# Patient Record
Sex: Male | Born: 1980 | Race: White | Hispanic: No | Marital: Married
Health system: Southern US, Community
[De-identification: ages and names within clinical notes are randomized; demographics above are authoritative.]

## PROBLEM LIST (undated history)

## (undated) DIAGNOSIS — I471 Supraventricular tachycardia, unspecified: Secondary | ICD-10-CM

---

## 2004-12-02 ENCOUNTER — Emergency Department: Payer: Self-pay | Admitting: Emergency Medicine

## 2005-01-22 ENCOUNTER — Emergency Department: Payer: Self-pay | Admitting: Unknown Physician Specialty

## 2006-03-24 IMAGING — CR DG KNEE COMPLETE 4+V*R*
1 series · 4 of 4 positions shown · non-contrast
Comparison: none

REASON FOR EXAM: knee injury
COMMENTS:  LMP: (Male)

PROCEDURE:     DXR - DXR KNEE RT COMP WITH OBLIQUES  - December 02, 2004  [DATE]
RESULT:     Four views reveals no acute fractures or dislocations. The joint
spaces are intact. No joint effusion is identified.

[Series 1: view not recorded · 0.17mm/px · 4 of 4 slices shown]
[im 1/4]
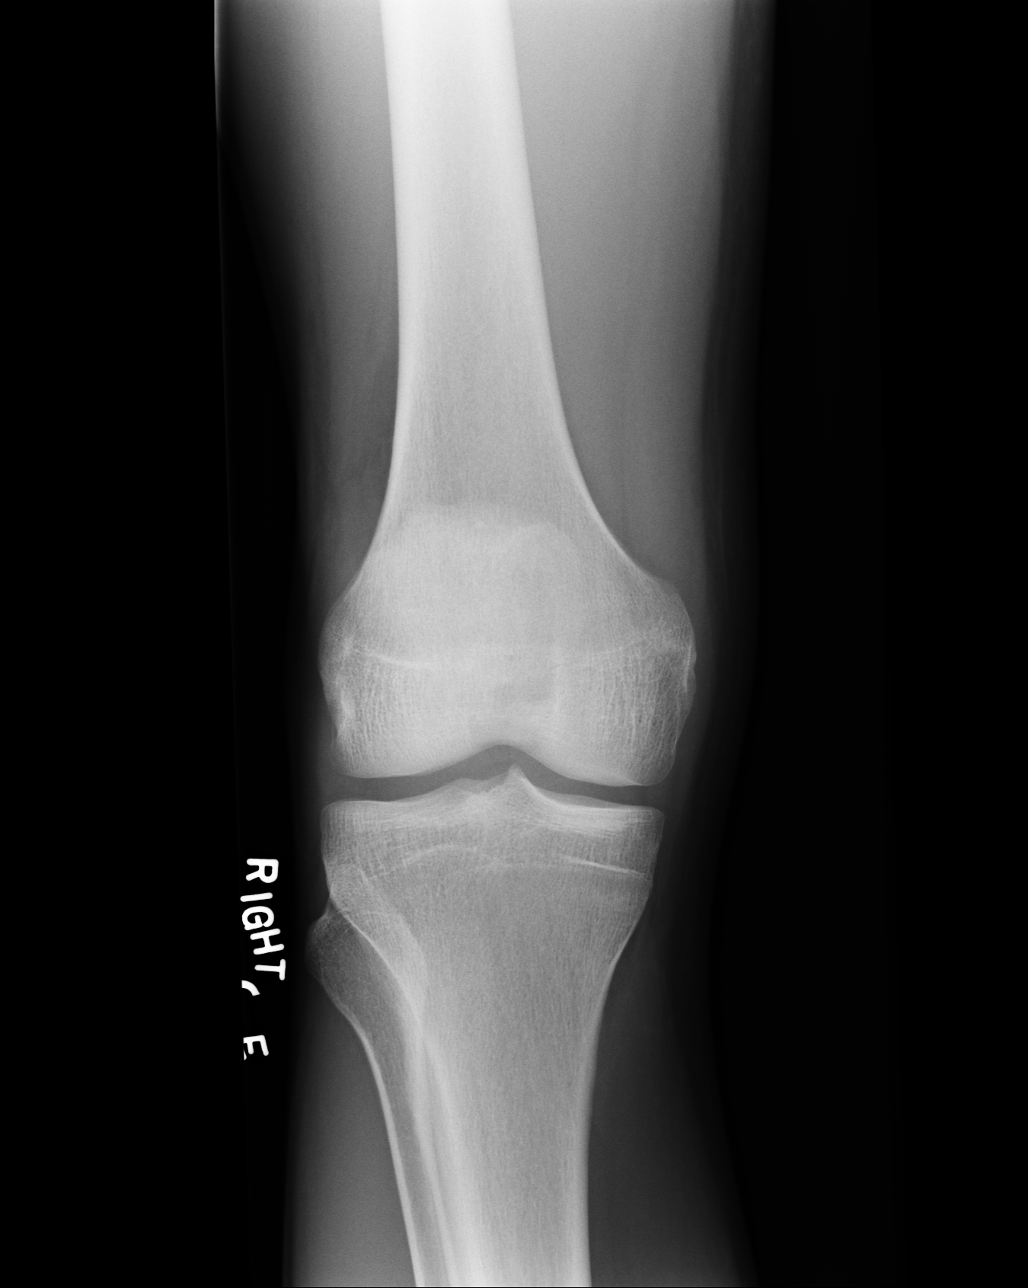
[im 2/4]
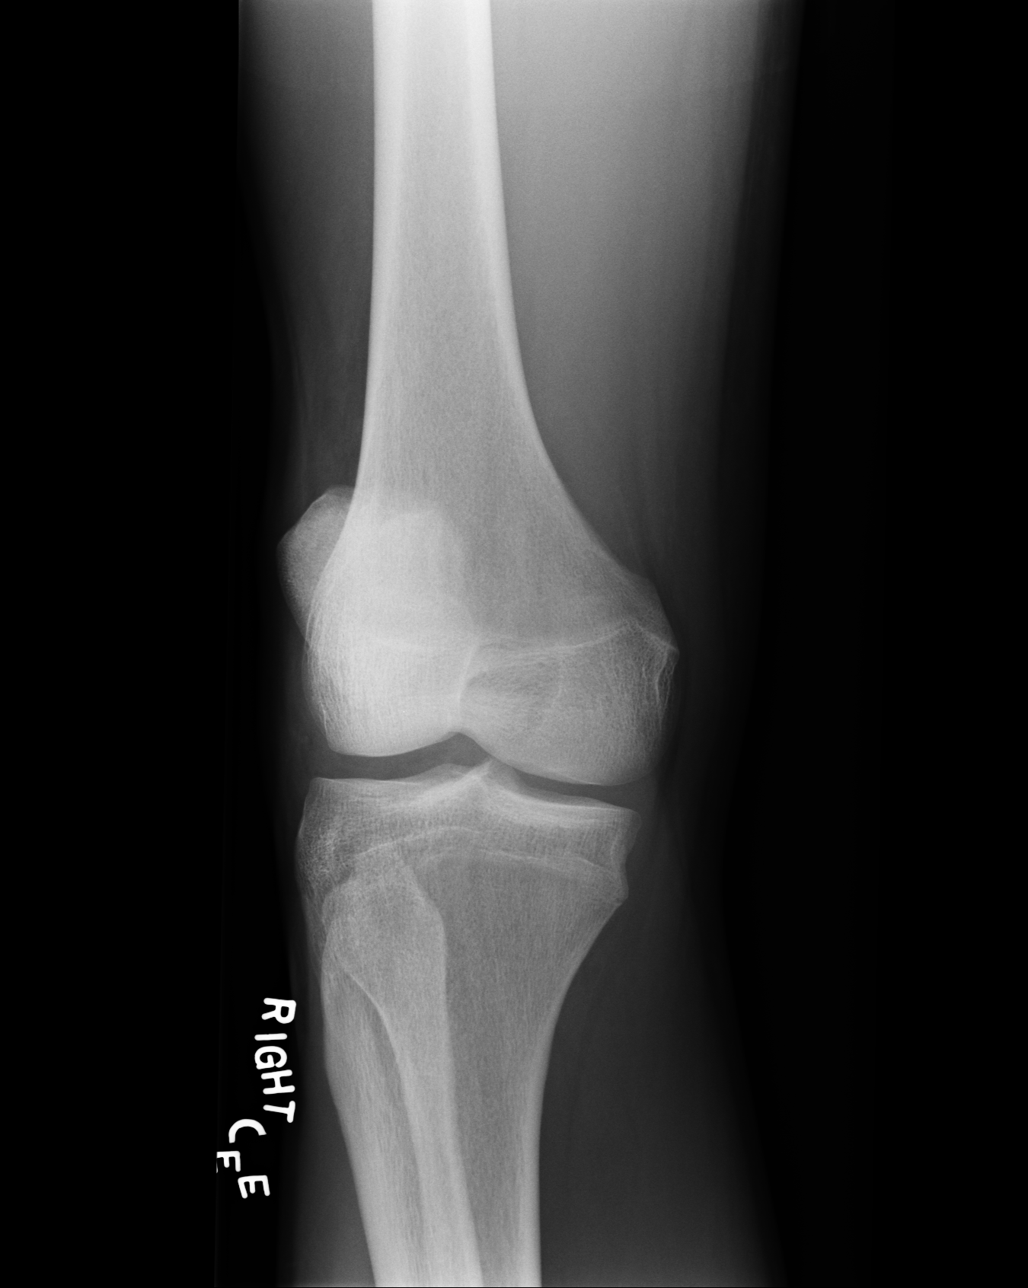
[im 3/4]
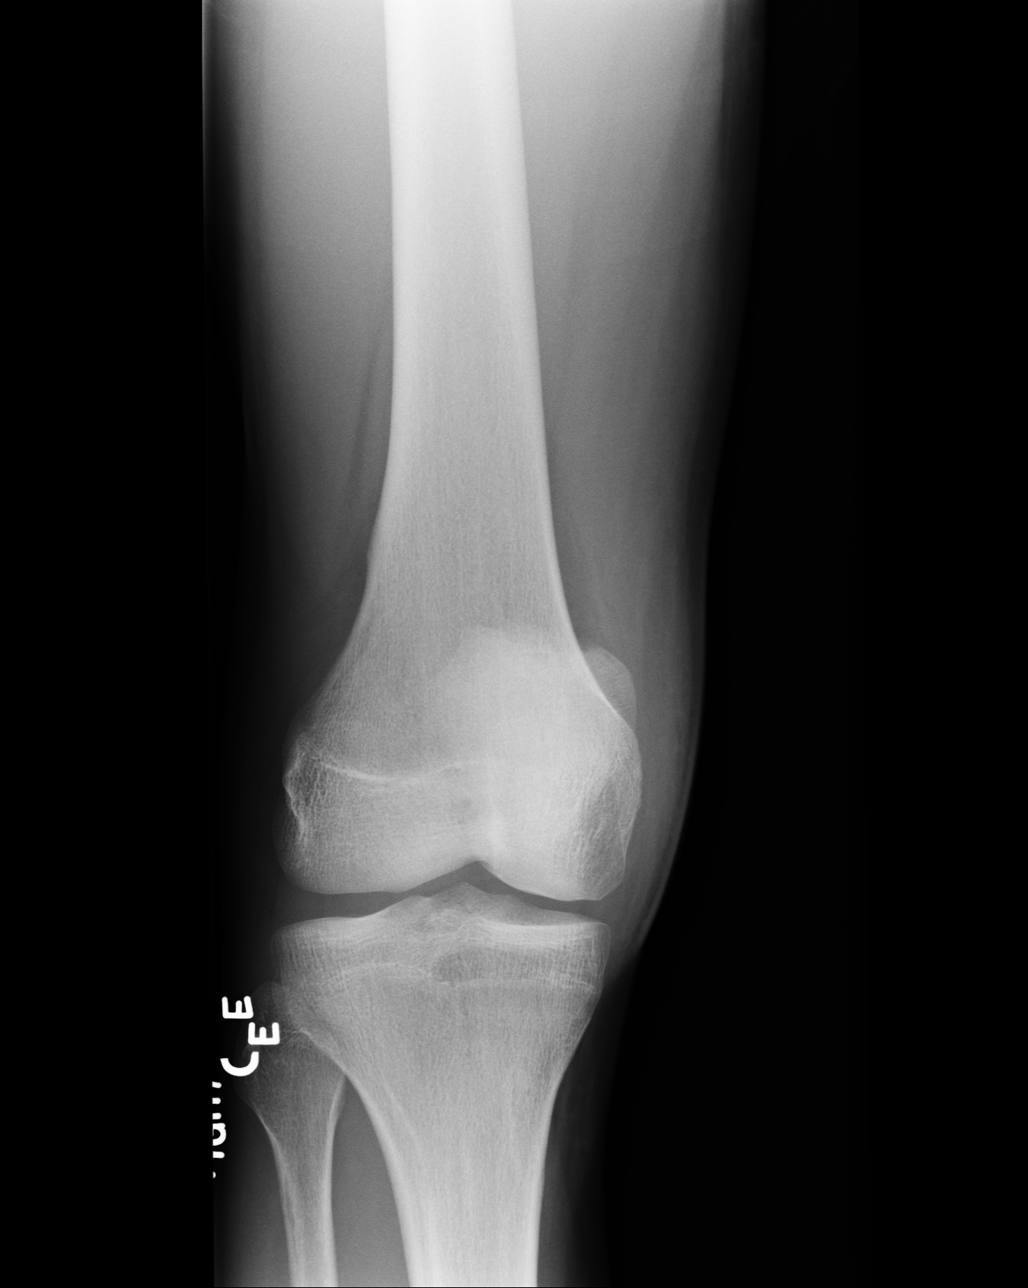
[im 4/4]
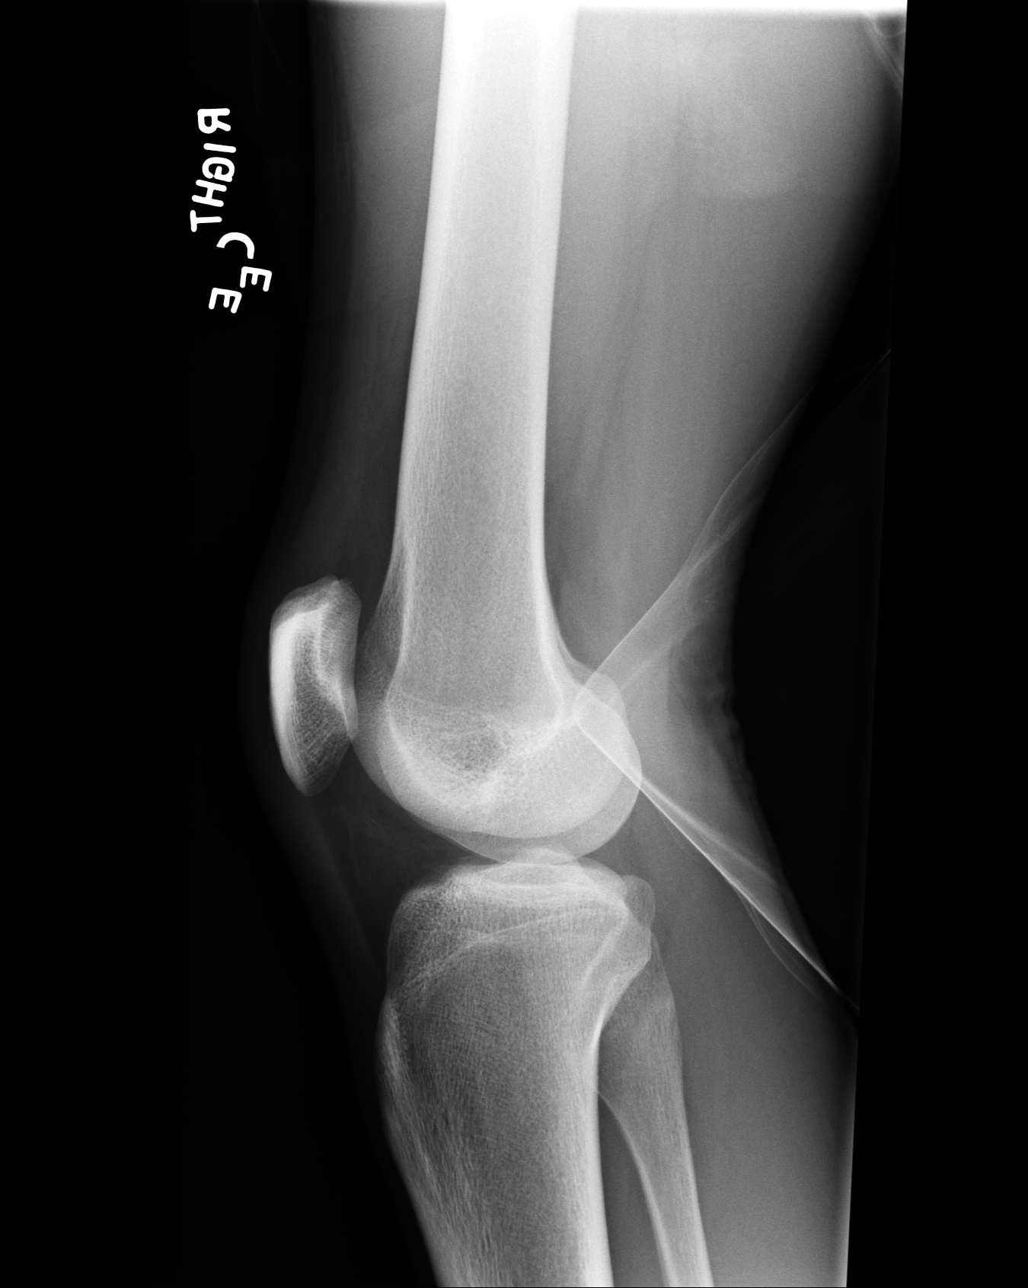

[4 of 4 positions shown; findings below may reference images not displayed]

IMPRESSION: 1)No acute fracture is seen of the RIGHT knee.

## 2013-09-21 ENCOUNTER — Emergency Department: Payer: Self-pay | Admitting: Emergency Medicine

## 2013-09-21 LAB — BASIC METABOLIC PANEL
ANION GAP: 7 (ref 7–16)
BUN: 14 mg/dL (ref 7–18)
CALCIUM: 9.2 mg/dL (ref 8.5–10.1)
CHLORIDE: 108 mmol/L — AB (ref 98–107)
CREATININE: 1.27 mg/dL (ref 0.60–1.30)
Co2: 25 mmol/L (ref 21–32)
EGFR (African American): >60
EGFR (Non-African Amer.): >60
Glucose: 101 mg/dL — ABNORMAL HIGH (ref 65–99)
Osmolality: 280 (ref 275–301)
Potassium: 3.1 mmol/L — ABNORMAL LOW (ref 3.5–5.1)
Sodium: 140 mmol/L (ref 136–145)

## 2013-09-21 LAB — CBC
HCT: 47.6 % (ref 40.0–52.0)
HGB: 16.3 g/dL (ref 13.0–18.0)
MCH: 32 pg (ref 26.0–34.0)
MCHC: 34.3 g/dL (ref 32.0–36.0)
MCV: 93 fL (ref 80–100)
Platelet: 355 10*3/uL (ref 150–440)
RBC: 5.1 10*6/uL (ref 4.40–5.90)
RDW: 12 % (ref 11.5–14.5)
WBC: 11.5 10*3/uL — ABNORMAL HIGH (ref 3.8–10.6)

## 2013-12-13 ENCOUNTER — Emergency Department: Payer: Self-pay | Admitting: Emergency Medicine

## 2013-12-13 LAB — PHOSPHORUS: PHOSPHORUS: 1.8 mg/dL — AB (ref 2.5–4.9)

## 2013-12-13 LAB — BASIC METABOLIC PANEL
Anion Gap: 8 (ref 7–16)
BUN: 13 mg/dL (ref 7–18)
CO2: 24 mmol/L (ref 21–32)
Calcium, Total: 9.3 mg/dL (ref 8.5–10.1)
Chloride: 104 mmol/L (ref 98–107)
Creatinine: 1.13 mg/dL (ref 0.60–1.30)
EGFR (African American): >60
GLUCOSE: 125 mg/dL — AB (ref 65–99)
OSMOLALITY: 274 (ref 275–301)
Potassium: 3.5 mmol/L (ref 3.5–5.1)
Sodium: 136 mmol/L (ref 136–145)

## 2013-12-13 LAB — CBC
HCT: 49 %
HGB: 16.5 g/dL
MCH: 32.1 pg
MCHC: 33.7 g/dL
MCV: 95 fL
Platelet: 275 10*3/uL
RBC: 5.13 x10 6/mm 3
RDW: 12.5 %
WBC: 9.6 10*3/uL

## 2013-12-13 LAB — TROPONIN I: Troponin-I: <0.02 ng/mL

## 2013-12-13 LAB — MAGNESIUM: Magnesium: 1.9 mg/dL

## 2015-04-04 IMAGING — CR DG CHEST 1V PORT
1 series · 2 of 2 positions shown · non-contrast
Comparison: None.

CLINICAL DATA: Tachycardia.

EXAM:
PORTABLE CHEST - 1 VIEW

[Series 1: ap · 0.17mm/px · 2 of 2 slices shown]
[im 1/2]
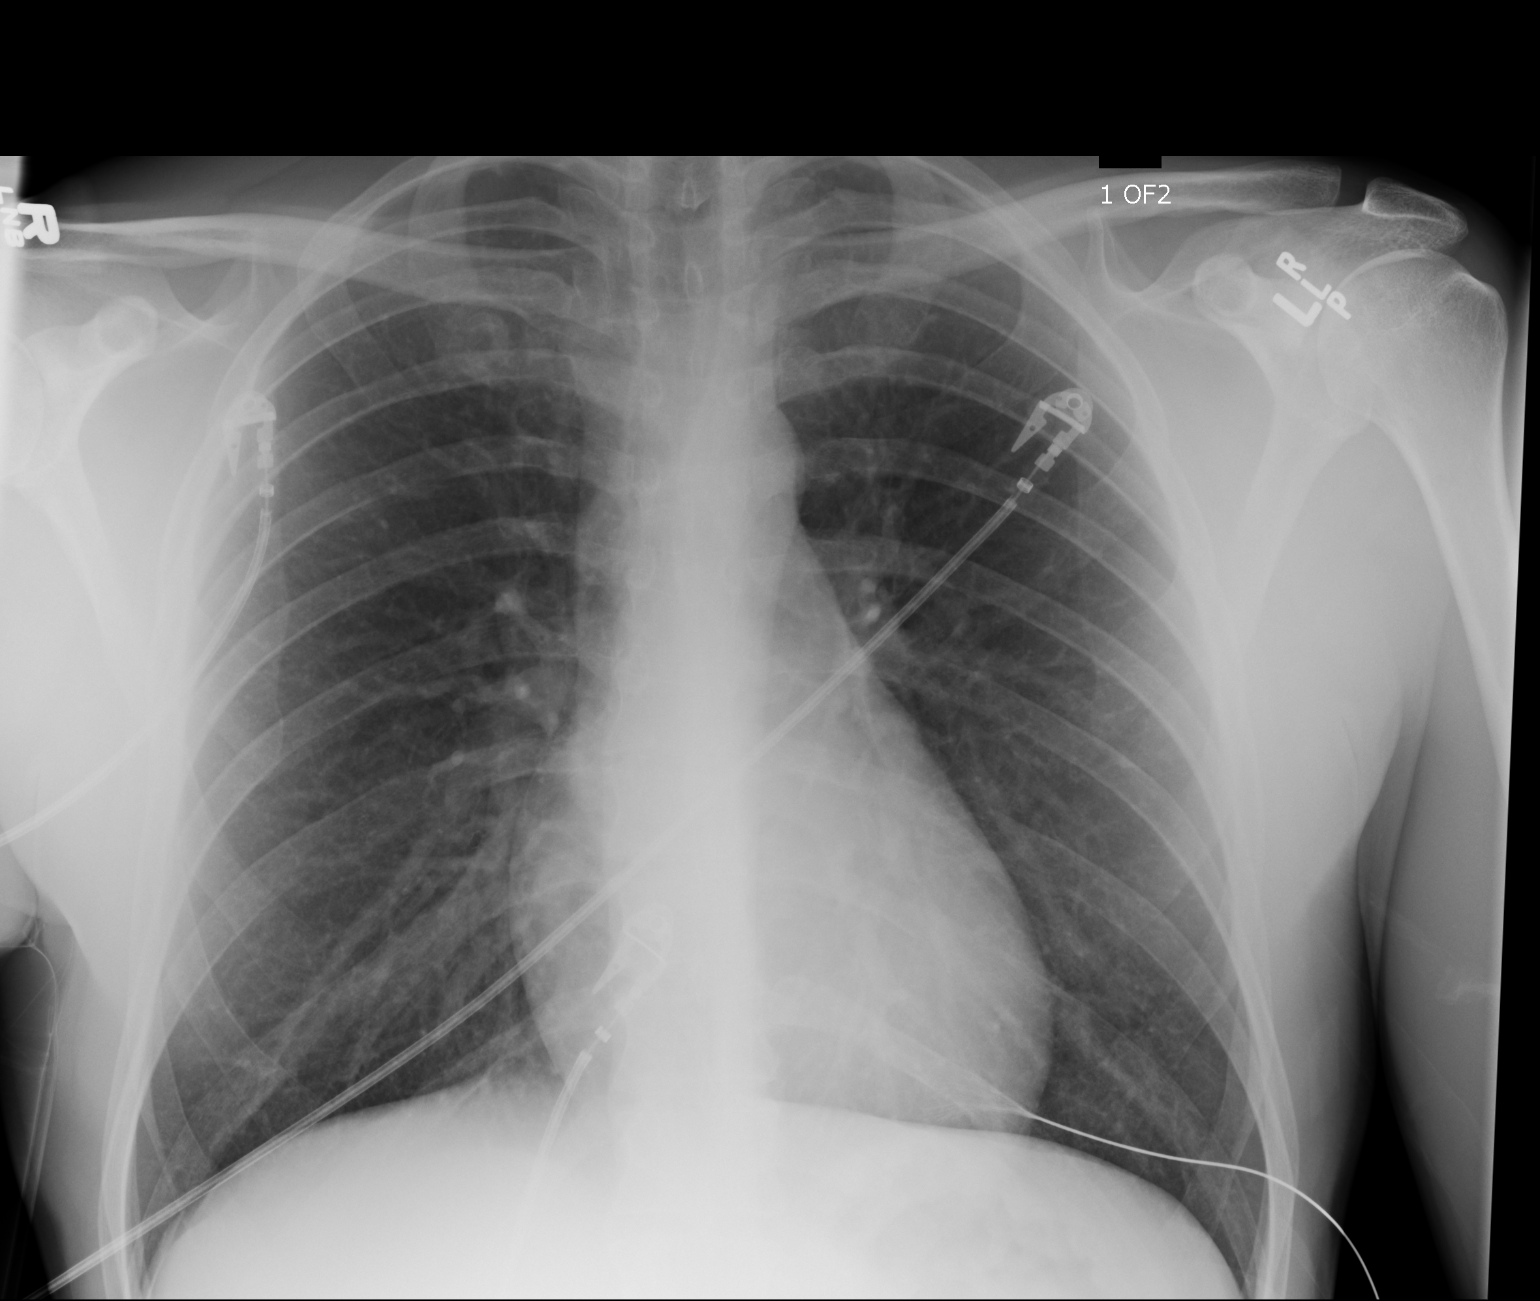
[im 2/2]
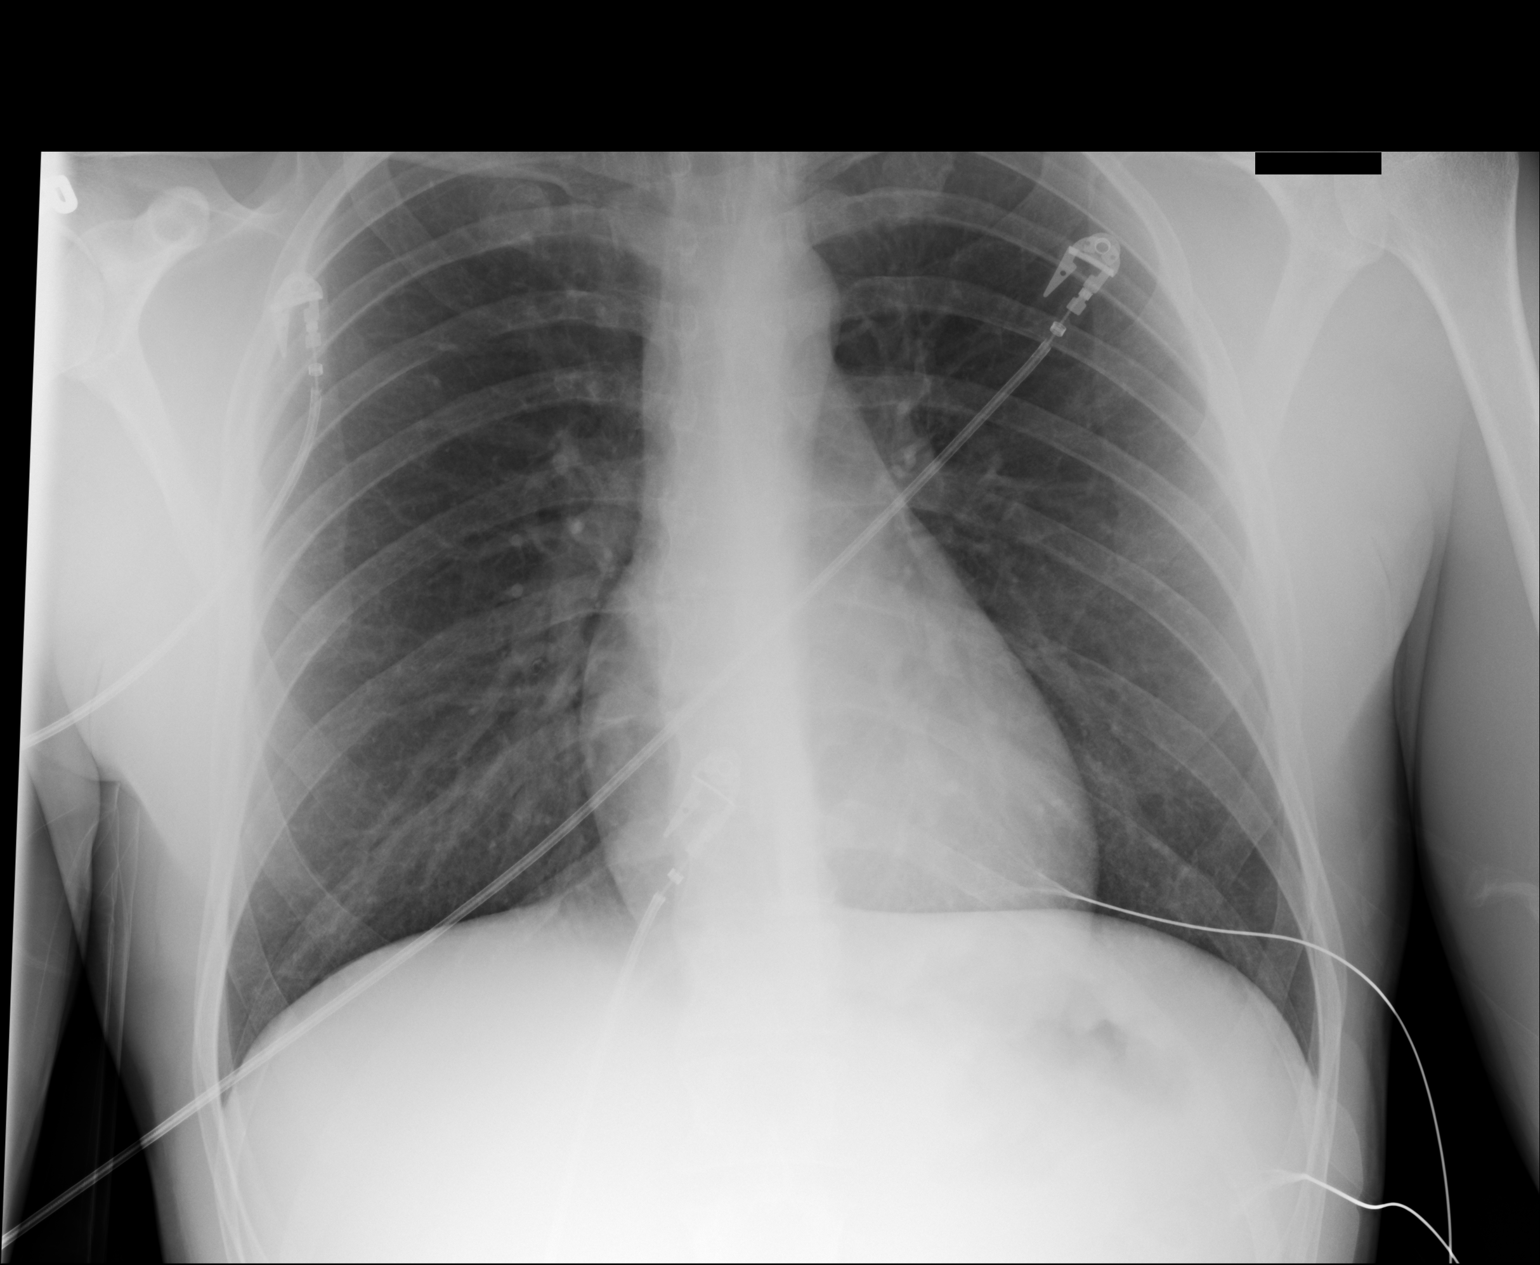

[2 of 2 positions shown; findings below may reference images not displayed]

FINDINGS: The heart size and mediastinal contours are within normal limits.
Both lungs are clear. The visualized skeletal structures are
unremarkable.
IMPRESSION: Negative one-view chest.

## 2016-12-12 ENCOUNTER — Emergency Department
Admission: EM | Admit: 2016-12-12 | Discharge: 2016-12-12 | Disposition: A | Discharge status: Home / Self Care | Payer: 59 | Attending: Emergency Medicine | Admitting: Emergency Medicine

## 2016-12-12 ENCOUNTER — Emergency Department: Payer: 59

## 2016-12-12 DIAGNOSIS — Y999 Unspecified external cause status: Secondary | ICD-10-CM | POA: Insufficient documentation

## 2016-12-12 DIAGNOSIS — S99922A Unspecified injury of left foot, initial encounter: Secondary | ICD-10-CM | POA: Diagnosis present

## 2016-12-12 DIAGNOSIS — Y939 Activity, unspecified: Secondary | ICD-10-CM | POA: Diagnosis not present

## 2016-12-12 DIAGNOSIS — Y929 Unspecified place or not applicable: Secondary | ICD-10-CM | POA: Diagnosis not present

## 2016-12-12 DIAGNOSIS — S92425A Nondisplaced fracture of distal phalanx of left great toe, initial encounter for closed fracture: Secondary | ICD-10-CM | POA: Diagnosis not present

## 2016-12-12 DIAGNOSIS — X58XXXA Exposure to other specified factors, initial encounter: Secondary | ICD-10-CM | POA: Diagnosis not present

## 2016-12-12 MED ORDER — TRAMADOL HCL 50 MG PO TABS: 50.0000 mg | ORAL_TABLET | Freq: Four times a day (QID) | ORAL | 0 refills | Status: AC | PRN

## 2016-12-12 MED ORDER — MELOXICAM 15 MG PO TABS: 15.0000 mg | ORAL_TABLET | Freq: Every day | ORAL | 0 refills | Status: AC

## 2016-12-12 NOTE — ED Provider Notes (Signed)
[facility] Emergency Department Provider Note ____________________________________________  Time seen: Approximately 8:29 PM  I have reviewed the triage vital signs and the nursing notes.   HISTORY  Chief Complaint Toe Pain    HPI Steven Curtis is a 36 y.o. male who presents to the emergency department for evaluation ofpain to the left great toe post injury.  No past medical history on file.  There are no active problems to display for this patient.   No past surgical history on file.  Prior to Admission medications   Medication Sig Start Date End Date Taking? Authorizing Provider  meloxicam (MOBIC) 15 MG tablet Take 1 tablet (15 mg total) by mouth daily. 12/12/16   Chinita Pester, FNP  traMADol (ULTRAM) 50 MG tablet Take 1 tablet (50 mg total) by mouth every 6 (six) hours as needed. 12/12/16   Chinita Pester, FNP    Allergies Patient has no known allergies.  No family history on file.  Social History Social History  Substance Use Topics  . Smoking status: Not on file  . Smokeless tobacco: Not on file  . Alcohol use Not on file    Review of Systems Constitutional: No recent illness. Musculoskeletal: Pain in Left great toe Skin: Negative for rash, wound, lesion. Neurological: Negative for focal weakness or numbness.  ____________________________________________   PHYSICAL EXAM:  VITAL SIGNS: ED Triage Vitals [12/12/16 1928]  Enc Vitals Group     BP 139/75     Pulse Rate 81     Resp 20     Temp 98.2 F (36.8 C)     Temp Source Oral     SpO2 100 %     Weight 158 lb (71.7 kg)     Height  (1.803 m)     Head Circumference      Peak Flow      Pain Score 7     Pain Loc      Pain Edu?      Excl. in GC?     Constitutional: Alert and oriented. Well appearing and in no acute distress. Eyes: Conjunctivae are normal. EOMI. Head: Atraumatic. Neck: No stridor.  Respiratory: Normal respiratory effort.   Musculoskeletal: Focal  tenderness over the medial aspect of the distal left great toe without obvious deformity. Neurologic:  Normal speech and language. No gross focal neurologic deficits are appreciated. Speech is normal. No gait instability. Skin:  Skin is warm, dry and intact. Atraumatic. Psychiatric: Mood and affect are normal. Speech and behavior are normal.  ____________________________________________   LABS (all labs ordered are listed, but only abnormal results are displayed)  Labs Reviewed - No data to display ____________________________________________  RADIOLOGY  Acute, nondisplaced first distal phalanx fracture of the left great toe. I, Kem Boroughs, personally viewed and evaluated these images (plain radiographs) as part of my medical decision making, as well as reviewing the written report by the radiologist.  ___________________________________________   PROCEDURES  Procedure(s) performed: After great toe buddy taped to the second toe and cast shoe applied by RN. Patient neurovascularly intact post-application.  ____________________________________________   INITIAL IMPRESSION / ASSESSMENT AND PLAN / ED COURSE  36 year old male presenting to the emergency department for evaluation after sustaining an injury to the left great toe. Nondisplaced first distal phalanx fracture identified on x-ray. He was instructed to rest, ice, and elevate the extremity. He was instructed to follow-up with podiatry if symptoms are not improving over the next couple weeks. He was given  meloxicam and tramadol prescriptions. He was advised to return to the emergency department for symptoms that change or worsen if he is unable schedule an appointment.  Pertinent labs & imaging results that were available during my care of the patient were reviewed by me and considered in my medical decision making (see chart for details).  _________________________________________   FINAL CLINICAL IMPRESSION(S) / ED  DIAGNOSES  Final diagnoses:  Closed nondisplaced fracture of distal phalanx of left great toe, initial encounter    Discharge Medication List as of 12/12/2016  8:32 PM    START taking these medications   Details  meloxicam (MOBIC) 15 MG tablet Take 1 tablet (15 mg total) by mouth daily., Starting Mon 12/12/2016, Print    traMADol (ULTRAM) 50 MG tablet Take 1 tablet (50 mg total) by mouth every 6 (six) hours as needed., Starting Mon 12/12/2016, Print        If controlled substance prescribed during this visit, 12 month history viewed on the [facility] prior to issuing an initial prescription for Schedule II or III opiod.    Chinita Pester, FNP 12/12/16 2316    Chinita Pester, FNP 12/12/16 2316    Minna Antis, MD 12/12/16 2322

## 2016-12-12 NOTE — ED Notes (Signed)
Pt discharged to home.  Family member driving.  Discharge instructions reviewed.  Verbalized understanding.  No questions or concerns at this time.  Teach back verified.  Pt in NAD.  No items left in ED.   

## 2018-04-03 IMAGING — DX DG FOOT COMPLETE 3+V*L*
3 series · 3 of 3 positions shown · non-contrast
Comparison: None.

CLINICAL DATA: LEFT great toe pain after squats tonight.

EXAM:
LEFT FOOT - COMPLETE 3+ VIEW

[foot ap]
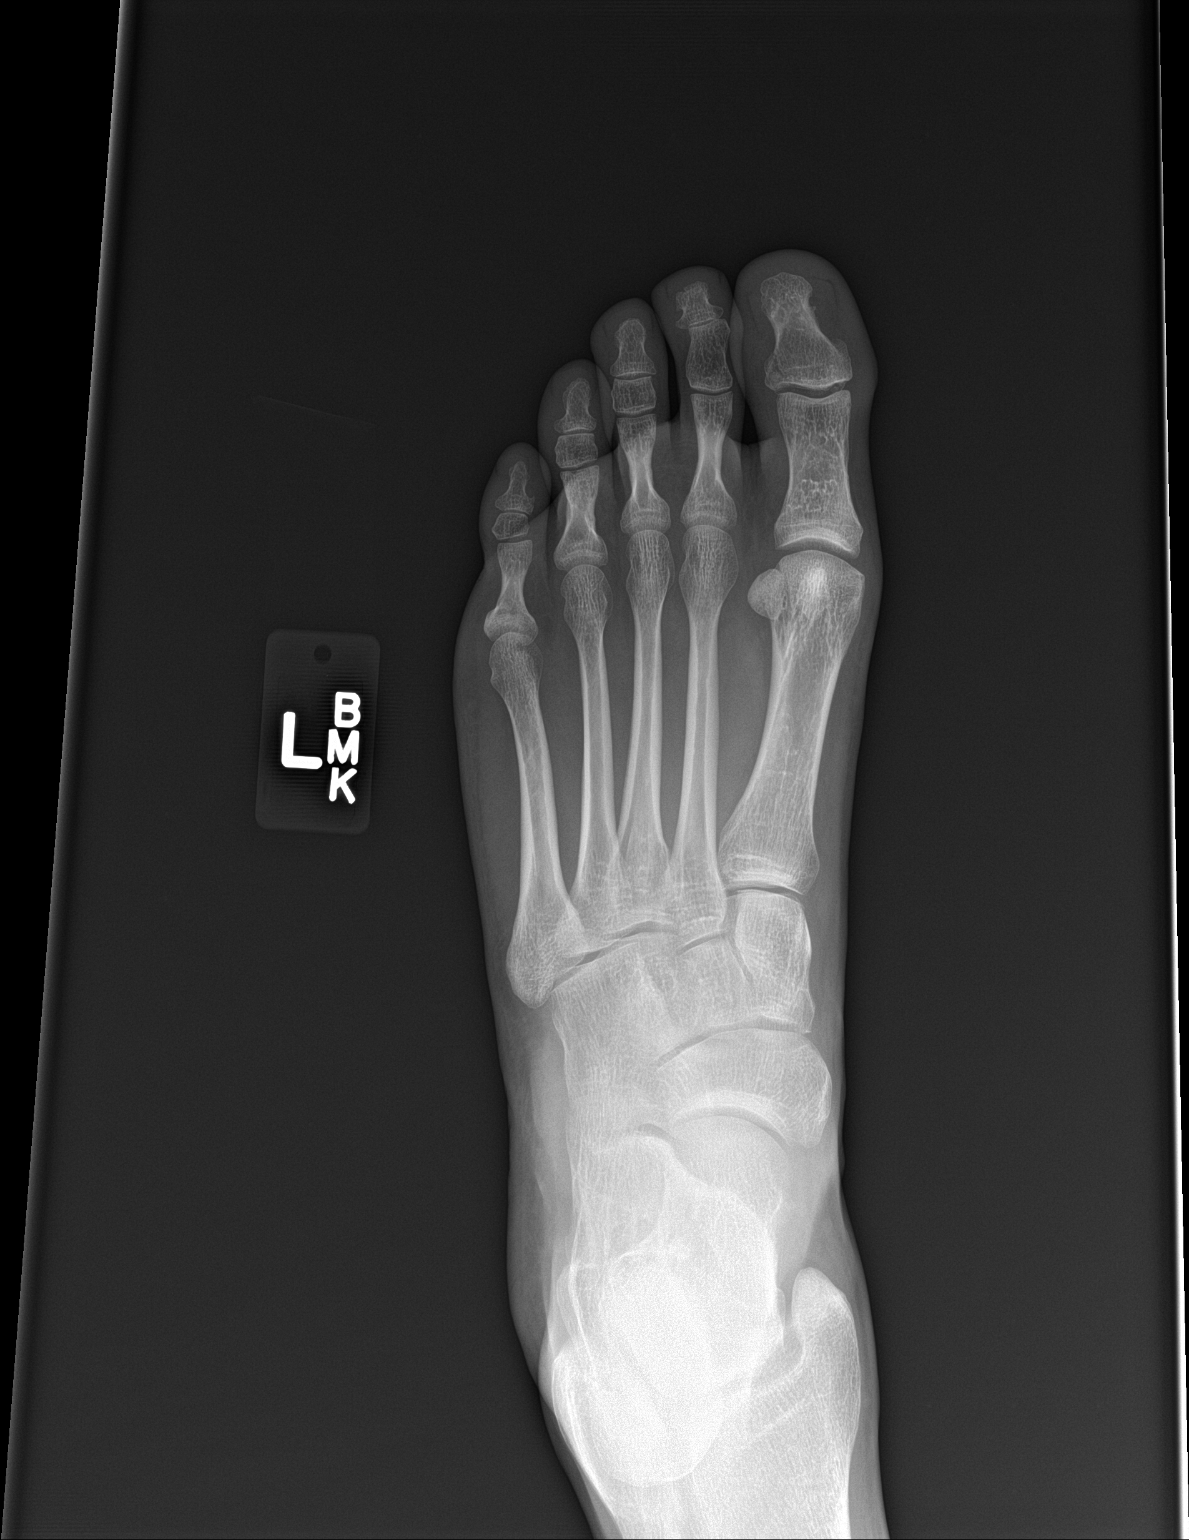

[foot obl]
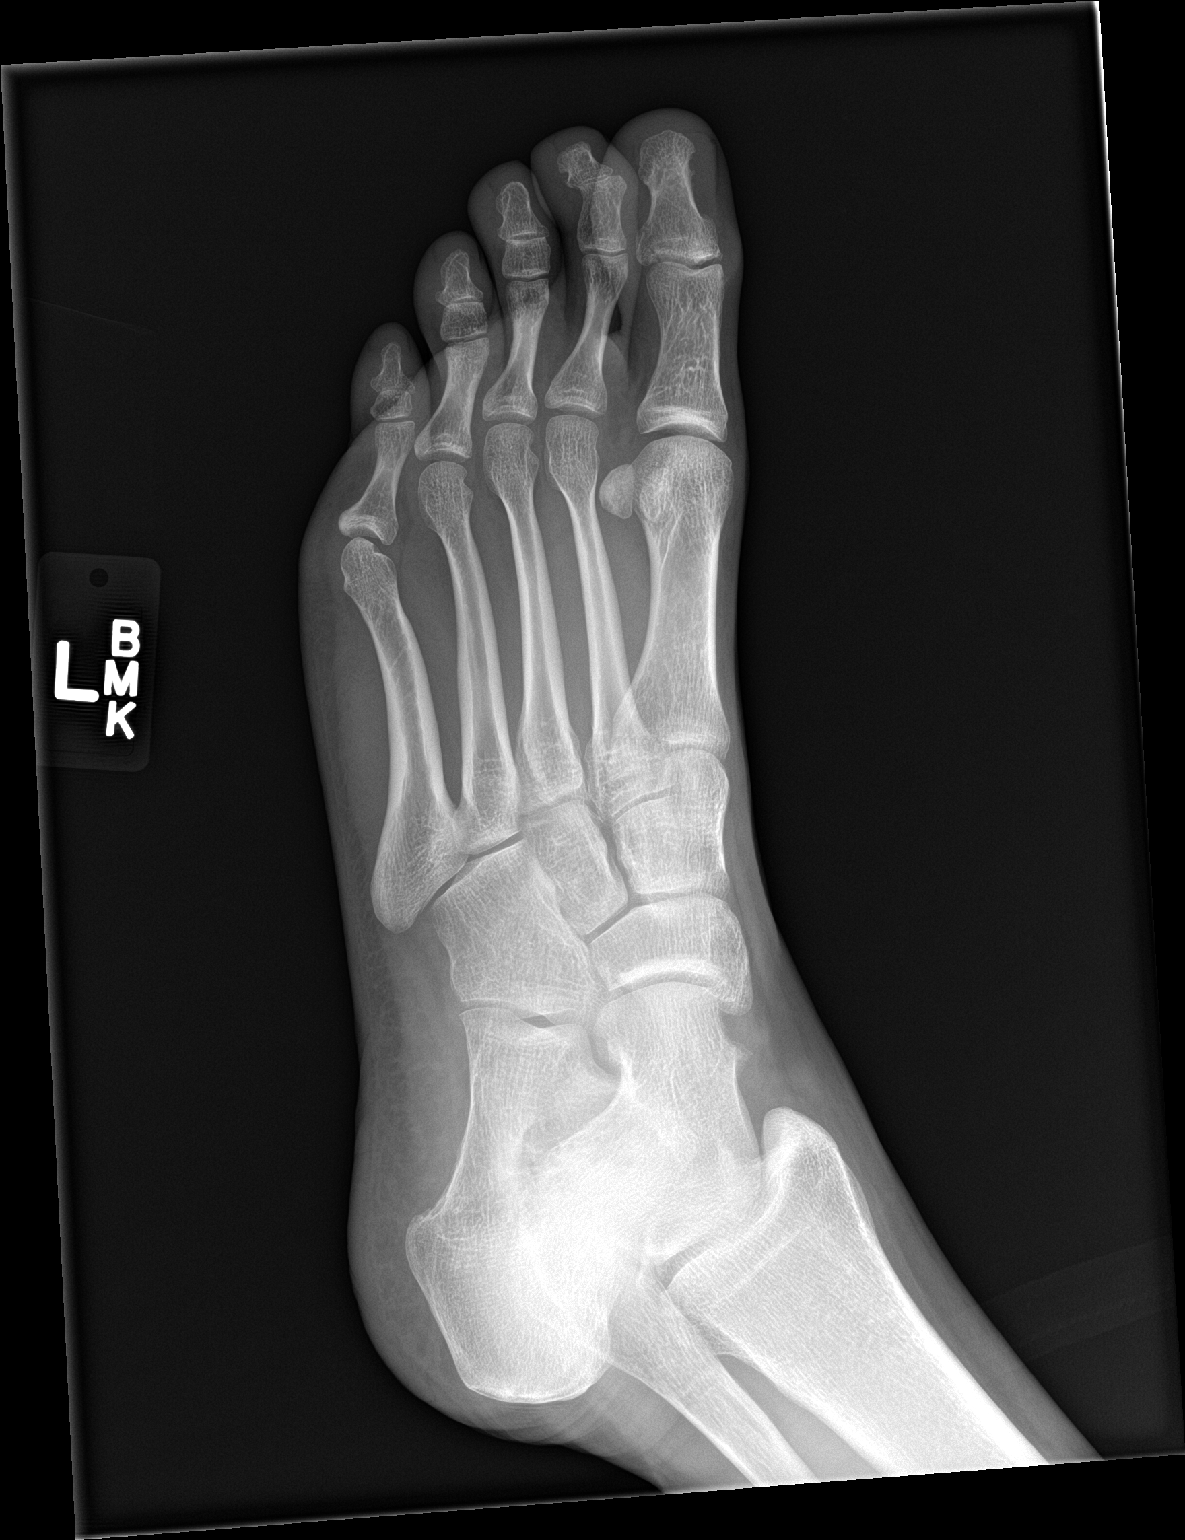

[foot lat]
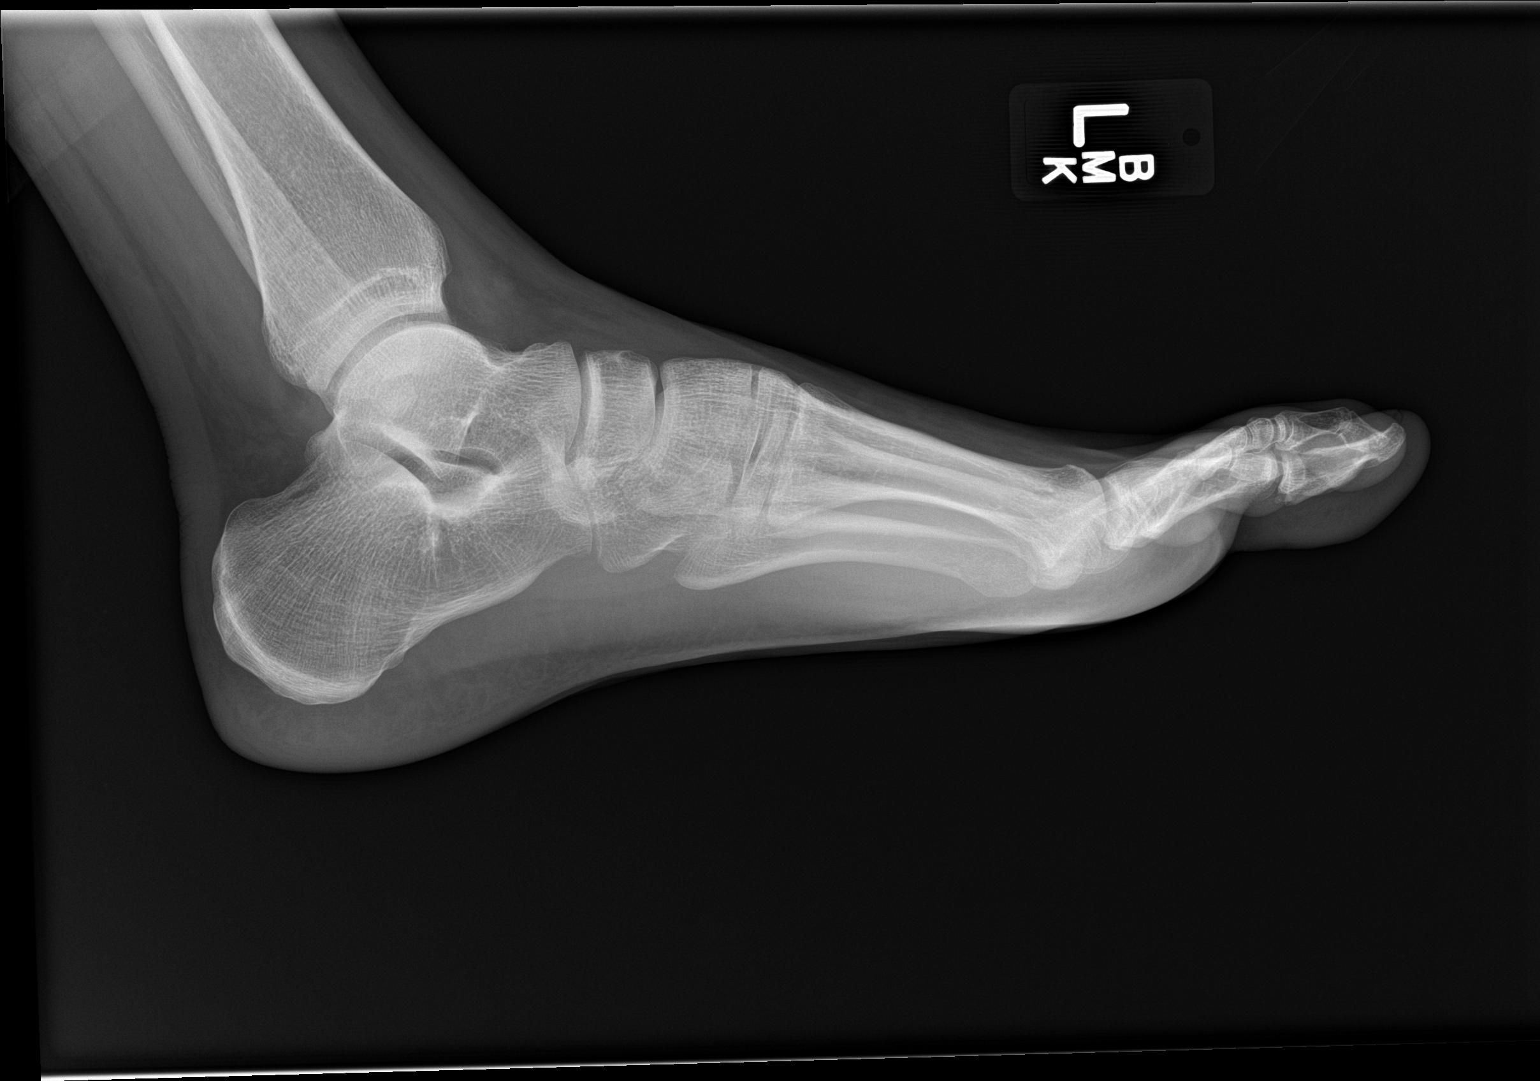

[3 of 3 positions shown; findings below may reference images not displayed]

FINDINGS: Acute nondisplaced oblique fracture through the lateral aspect first
distal phalanx with the intra-articular extension to the
interphalangeal joint space. No destructive bony lesions. No
dislocation. Soft tissue planes are normal.
IMPRESSION: Acute nondisplaced first distal phalanx fracture.  No dislocation.

## 2018-04-11 ENCOUNTER — Encounter: Payer: Self-pay | Admitting: Emergency Medicine

## 2018-04-11 ENCOUNTER — Other Ambulatory Visit: Payer: Self-pay

## 2018-04-11 ENCOUNTER — Emergency Department
Admission: EM | Admit: 2018-04-11 | Discharge: 2018-04-11 | Disposition: A | Discharge status: Home / Self Care | Payer: 59 | Attending: Emergency Medicine | Admitting: Emergency Medicine

## 2018-04-11 DIAGNOSIS — I471 Supraventricular tachycardia: Secondary | ICD-10-CM | POA: Diagnosis not present

## 2018-04-11 DIAGNOSIS — Z79899 Other long term (current) drug therapy: Secondary | ICD-10-CM | POA: Diagnosis not present

## 2018-04-11 DIAGNOSIS — R0602 Shortness of breath: Secondary | ICD-10-CM | POA: Diagnosis present

## 2018-04-11 HISTORY — DX: Supraventricular tachycardia: I47.1

## 2018-04-11 HISTORY — DX: Supraventricular tachycardia, unspecified: I47.10

## 2018-04-11 MED ORDER — ADENOSINE 12 MG/4ML IV SOLN
INTRAVENOUS | Status: AC
  Filled 2018-04-11: qty 4

## 2018-04-11 NOTE — ED Provider Notes (Addendum)
[facility] Emergency Department Provider Note  ____________________________________________   I have reviewed the triage vital signs and the nursing notes. Where available I have reviewed prior notes and, if possible and indicated, outside hospital notes.    HISTORY  Chief Complaint Tachycardia    HPI Steven Curtis is a 37 y.o. male with a history of recurrent SVT, has had to have adenosine several times he states, he has seen electrophysiologist you have recommended ablation but he decided not to have that done.  He states that it has been a year or 2 since his last episode.  He states that is not been otherwise ill he was at home doing normal activities and went into SVT about 45 minutes ago and drove himself in here.  No chest pain.  He states he could feel slightly short of breath.  He knew it was SVT, felt the palpitations and he feels that his shortness of breath is mostly due to anxiety about receiving adenosine.  In any event, patient is compliant with his metoprolol has no other complaints and presents with a heart rate of 212       Past Medical History:  Diagnosis Date  . SVT (supraventricular tachycardia) (HCC)     There are no active problems to display for this patient.   History reviewed. No pertinent surgical history.  Prior to Admission medications   Medication Sig Start Date End Date Taking? Authorizing Provider  meloxicam (MOBIC) 15 MG tablet Take 1 tablet (15 mg total) by mouth daily. 12/12/16   Triplett, Cari B, FNP  traMADol (ULTRAM) 50 MG tablet Take 1 tablet (50 mg total) by mouth every 6 (six) hours as needed. 12/12/16   Chinita Pesterriplett, Cari B, FNP    Allergies Patient has no known allergies.  No family history on file.  Social History Social History   Tobacco Use  . Smoking status: Not on file  Substance Use Topics  . Alcohol use: Never    Frequency: Never  . Drug use: Never    Review of Systems Constitutional: No  fever/chills Eyes: No visual changes. ENT: No sore throat. No stiff neck no neck pain Cardiovascular: Denies chest pain. Respiratory: See HPI Gastrointestinal:   no vomiting.  No diarrhea.  No constipation. Genitourinary: Negative for dysuria. Musculoskeletal: Negative lower extremity swelling Skin: Negative for rash. Neurological: Negative for severe headaches, focal weakness or numbness.   ____________________________________________   PHYSICAL EXAM:  VITAL SIGNS: ED Triage Vitals  Enc Vitals Group     BP 04/11/18 1517 103/89     Pulse Rate 04/11/18 1517 95     Resp 04/11/18 1517 (!) 26     Temp 04/11/18 1517 98.1 F (36.7 C)     Temp Source 04/11/18 1517 Oral     SpO2 04/11/18 1517 100 %     Weight 04/11/18 1518 155 lb (70.3 kg)     Height 04/11/18 1518 5\' 11"  (1.803 m)     Head Circumference --      Peak Flow --      Pain Score 04/11/18 1518 0     Pain Loc --      Pain Edu? --      Excl. in GC? --     Constitutional: Alert and oriented.  Healthy-appearing young male anxious nontoxic Eyes: Conjunctivae are normal Head: Atraumatic HEENT: No congestion/rhinnorhea. Mucous membranes are moist.  Oropharynx non-erythematous Neck:   Nontender with no meningismus, no masses, no stridor Cardiovascular: Fast heart  rate around 200, regular rhythm. Grossly normal heart sounds.  Good peripheral circulation. Respiratory: Normal respiratory effort.  No retractions. Lungs CTAB. Abdominal: Soft and nontender. No distention. No guarding no rebound Back:  There is no focal tenderness or step off.  there is no midline tenderness there are no lesions noted. there is no CVA tenderness Musculoskeletal: No lower extremity tenderness, no upper extremity tenderness. No joint effusions, no DVT signs strong distal pulses no edema Neurologic:  Normal speech and language. No gross focal neurologic deficits are appreciated.  Skin:  Skin is warm, dry and intact. No rash noted. Psychiatric: Mood  and affect are normal. Speech and behavior are normal.  ____________________________________________   LABS (all labs ordered are listed, but only abnormal results are displayed)  Labs Reviewed - No data to display  Pertinent labs  results that were available during my care of the patient were reviewed by me and considered in my medical decision making (see chart for details). ____________________________________________  EKG  I personally interpreted any EKGs ordered by me or triage EKG shows SVT rate 212, narrow complex, and ossific ST changes likely rate related, normal axis After NSR conversion, rates 102, normal axis, diffuse minimal ST changes noted. ----------------------------------------- 3:55 PM on 04/11/2018 -----------------------------------------  Extremity 5 bpm no acute ST elevation or depression normal axis, anteriorly seen ST depressions of all resolved after few minutes.  ____________________________________________  RADIOLOGY  Pertinent labs & imaging results that were available during my care of the patient were reviewed by me and considered in my medical decision making (see chart for details). If possible, patient and/or family made aware of any abnormal findings.  No results found. ____________________________________________    PROCEDURES  Procedure(s) performed:   SVT cardioversion Timeout performed patient consent obtained Patient given a 10 cc syringe instructed to blow into it very hard, after about 15 seconds of that, we lay his head back and lifted his legs up.  Initially, nothing happened on the second go around however we had a direct conversion to sinus rhythm, patient immediately felt better no complications  Procedures  Critical Care performed: CRITICAL CARE Performed by: Jeanmarie Plant   Total critical care time: 36 minutes  Critical care time was exclusive of separately billable procedures and treating other patients.  Critical  care was necessary to treat or prevent imminent or life-threatening deterioration.  Critical care was time spent personally by me on the following activities: development of treatment plan with patient and/or surrogate as well as nursing, discussions with consultants, evaluation of patient's response to treatment, examination of patient, obtaining history from patient or surrogate, ordering and performing treatments and interventions, ordering and review of laboratory studies, ordering and review of radiographic studies, pulse oximetry and re-evaluation of patient's condition.   ____________________________________________   INITIAL IMPRESSION / ASSESSMENT AND PLAN / ED COURSE  Pertinent labs & imaging results that were available during my care of the patient were reviewed by me and considered in my medical decision making (see chart for details). Patient with SVT, long history of same, presents today with SVT, converted, Which was again present today. with Valsalva maneuver.  he does have rate related changes seen on prior EKGs of diffuse ST depression which was again noted today, he has no chest pain or shortness of breath, he would prefer not to have any further work-up.  We will recheck EKG, he was not antecedently ill has no ongoing symptoms after he converts, and again would prefer not to have  blood work done if possible.  We will reassess repeat EKG.  Vital signs are reassuring.  Patient was cardioverted within 5 minutes of arrival.  ----------------------------------------- 3:59 PM on 04/11/2018 -----------------------------------------  D/w dr. Lady Gary, agrees with no further work-up, he understands EKG changes and feels that they are related related, does not wish any troponins or other enzymes which I agree with.  Patient is already on 25 mg of metoprolol ER, he did not recommend any change in that, they will see him as an outpatient.  Patient has been absolutely asymptomatic and since we  cardioverted him, we will discharge.  Return precautions and follow-up given and understood.    ____________________________________________   FINAL CLINICAL IMPRESSION(S) / ED DIAGNOSES  Final diagnoses:  None      This chart was dictated using voice recognition software.  Despite best efforts to proofread,  errors can occur which can change meaning.      Jeanmarie Plant, MD 04/11/18 1527    Jeanmarie Plant, MD 04/11/18 1556    Jeanmarie Plant, MD 04/11/18 1559    Jeanmarie Plant, MD 04/11/18 1600

## 2018-04-11 NOTE — ED Triage Notes (Signed)
Patient reports working and feeling his heart speed up, became lightheaded and short of breath. Patient with history of SVT. States he take metoprolol daily. HR in 200s upon arrival. Patient converted to NSR after valsalva maneuver by MD.

## 2018-04-11 NOTE — ED Notes (Signed)
Patient ambulatory to lobby with steady gait and NAD noted. Verbalized understanding of discharge instructions and follow-up care.  

## 2018-04-11 NOTE — Discharge Instructions (Addendum)
Continue take the metoprolol, return to the emergency room for any new or worrisome symptoms, follow-up with cardiology as an outpatient.  Drink plenty of fluids.  If you have chest pain or shortness of breath or you feel worse in any way return to the emergency department

## 2019-11-30 ENCOUNTER — Other Ambulatory Visit: Payer: Self-pay

## 2019-11-30 ENCOUNTER — Emergency Department
Admission: EM | Admit: 2019-11-30 | Discharge: 2019-11-30 | Disposition: A | Discharge status: Home / Self Care | Payer: 59 | Attending: Emergency Medicine | Admitting: Emergency Medicine

## 2019-11-30 DIAGNOSIS — W269XXA Contact with unspecified sharp object(s), initial encounter: Secondary | ICD-10-CM | POA: Insufficient documentation

## 2019-11-30 DIAGNOSIS — Y999 Unspecified external cause status: Secondary | ICD-10-CM | POA: Insufficient documentation

## 2019-11-30 DIAGNOSIS — Z79899 Other long term (current) drug therapy: Secondary | ICD-10-CM | POA: Insufficient documentation

## 2019-11-30 DIAGNOSIS — S61212A Laceration without foreign body of right middle finger without damage to nail, initial encounter: Secondary | ICD-10-CM | POA: Diagnosis not present

## 2019-11-30 DIAGNOSIS — Y93G1 Activity, food preparation and clean up: Secondary | ICD-10-CM | POA: Diagnosis not present

## 2019-11-30 DIAGNOSIS — Y9289 Other specified places as the place of occurrence of the external cause: Secondary | ICD-10-CM | POA: Insufficient documentation

## 2019-11-30 NOTE — ED Provider Notes (Signed)
Emergency Department Provider Note  ____________________________________________  Time seen: Approximately 8:02 PM  I have reviewed the triage vital signs and the nursing notes.   HISTORY  Chief Complaint Laceration   Historian Patient    HPI Steven Curtis is a 39 y.o. male presents to the emergency department with a 0.5 cm avulsion laceration along tip of right middle finger sustained accidentally with a potato chopper.  Patient reports his tetanus status is up-to-date.  No numbness or tingling of the right hand.  No other alleviating measures been attempted.   Past Medical History:  Diagnosis Date  . SVT (supraventricular tachycardia) (HCC)      Immunizations up to date:  Yes.     Past Medical History:  Diagnosis Date  . SVT (supraventricular tachycardia) (HCC)     There are no problems to display for this patient.   History reviewed. No pertinent surgical history.  Prior to Admission medications   Medication Sig Start Date End Date Taking? Authorizing Provider  meloxicam (MOBIC) 15 MG tablet Take 1 tablet (15 mg total) by mouth daily. 12/12/16   Triplett, Cari B, FNP  traMADol (ULTRAM) 50 MG tablet Take 1 tablet (50 mg total) by mouth every 6 (six) hours as needed. 12/12/16   Chinita Pester, FNP    Allergies Patient has no known allergies.  History reviewed. No pertinent family history.  Social History Social History   Tobacco Use  . Smoking status: Not on file  Substance Use Topics  . Alcohol use: Never  . Drug use: Never     Review of Systems  Constitutional: No fever/chills Eyes:  No discharge ENT: No upper respiratory complaints. Respiratory: no cough. No SOB/ use of accessory muscles to breath Gastrointestinal:   No nausea, no vomiting.  No diarrhea.  No constipation. Musculoskeletal: Negative for musculoskeletal pain. Skin: Patient has laceration.    ____________________________________________   PHYSICAL EXAM:  VITAL  SIGNS: ED Triage Vitals  Enc Vitals Group     BP 11/30/19 1617 (!) 136/95     Pulse Rate 11/30/19 1614 98     Resp 11/30/19 1614 18     Temp 11/30/19 1614 98.5 F (36.9 C)     Temp Source 11/30/19 1614 Oral     SpO2 11/30/19 1614 99 %     Weight 11/30/19 1615 155 lb (70.3 kg)     Height 11/30/19 1615 5\' 11"  (1.803 m)     Head Circumference --      Peak Flow --      Pain Score 11/30/19 1615 7     Pain Loc --      Pain Edu? --      Excl. in GC? --      Constitutional: Alert and oriented. Well appearing and in no acute distress. Eyes: Conjunctivae are normal. PERRL. EOMI. Head: Atraumatic. Cardiovascular: Normal rate, regular rhythm. Normal S1 and S2.  Good peripheral circulation. Respiratory: Normal respiratory effort without tachypnea or retractions. Lungs CTAB. Good air entry to the bases with no decreased or absent breath sounds Gastrointestinal: Bowel sounds x 4 quadrants. Soft and nontender to palpation. No guarding or rigidity. No distention. Musculoskeletal: Full range of motion to all extremities. No obvious deformities noted Neurologic:  Normal for age. No gross focal neurologic deficits are appreciated.  Skin: Patient has a 0.5 centimeters laceration along the tip of the right middle finger deep to underlying dermis.  Psychiatric: Mood and affect are normal for age. Speech and behavior are normal.  ____________________________________________   LABS (all labs ordered are listed, but only abnormal results are displayed)  Labs Reviewed - No data to display ____________________________________________  EKG   ____________________________________________  RADIOLOGY   No results found.  ____________________________________________    PROCEDURES  Procedure(s) performed:     Marland KitchenMarland KitchenLaceration Repair  Date/Time: 11/30/2019 8:04 PM Performed by: Lannie Fields, PA-C Authorized by: Lannie Fields, PA-C   Consent:    Consent given by:  Patient   Risks  discussed:  Pain Anesthesia (see MAR for exact dosages):    Anesthesia method:  None Laceration details:    Location:  Finger   Finger location:  R long finger   Length (cm):  0.5   Depth (mm):  1 Repair type:    Repair type:  Simple Exploration:    Contaminated: no   Treatment:    Area cleansed with:  Betadine   Amount of cleaning:  Standard Skin repair:    Repair method:  Tissue adhesive Post-procedure details:    Dressing:  Open (no dressing)       Medications - No data to display   ____________________________________________   INITIAL IMPRESSION / ASSESSMENT AND PLAN / ED COURSE  Pertinent labs & imaging results that were available during my care of the patient were reviewed by me and considered in my medical decision making (see chart for details).      Assessment and plan: Laceration 39 year old male presents to the emergency department with a right middle finger avulsion type laceration.  Finger tourniquet was applied initially in the emergency department and bleeding was well controlled.  Dermabond was applied.  Tylenol and ibuprofen alternating for pain were recommended.  Patient reported that his tetanus status is up-to-date.  All patient questions were answered.   ____________________________________________  FINAL CLINICAL IMPRESSION(S) / ED DIAGNOSES  Final diagnoses:  Laceration of right middle finger without foreign body without damage to nail, initial encounter      NEW MEDICATIONS STARTED DURING THIS VISIT:  ED Discharge Orders    None          This chart was dictated using voice recognition software/Dragon. Despite best efforts to proofread, errors can occur which can change the meaning. Any change was purely unintentional.     Karren Cobble 11/30/19 Derrell Lolling, MD 12/02/19 [phone]

## 2019-11-30 NOTE — ED Triage Notes (Signed)
Pt comes POV after cutting end of right middle finger with potato chopper. Dressed and bleeding minimally at this time.

## 2020-01-29 ENCOUNTER — Encounter: Payer: Self-pay | Admitting: Emergency Medicine

## 2020-01-29 ENCOUNTER — Other Ambulatory Visit: Payer: Self-pay

## 2020-01-29 ENCOUNTER — Emergency Department
Admission: EM | Admit: 2020-01-29 | Discharge: 2020-01-29 | Disposition: A | Discharge status: Home / Self Care | Payer: No Typology Code available for payment source | Attending: Emergency Medicine | Admitting: Emergency Medicine

## 2020-01-29 DIAGNOSIS — I471 Supraventricular tachycardia: Secondary | ICD-10-CM | POA: Diagnosis not present

## 2020-01-29 DIAGNOSIS — R002 Palpitations: Secondary | ICD-10-CM | POA: Diagnosis present

## 2020-01-29 LAB — BASIC METABOLIC PANEL
Anion gap: 14 (ref 5–15)
BUN: 10 mg/dL (ref 6–20)
CO2: 21 mmol/L — ABNORMAL LOW (ref 22–32)
Calcium: 9.8 mg/dL (ref 8.9–10.3)
Chloride: 104 mmol/L (ref 98–111)
Creatinine, Ser: 1.68 mg/dL — ABNORMAL HIGH (ref 0.61–1.24)
GFR calc Af Amer: 59 mL/min — ABNORMAL LOW (ref >60)
GFR calc non Af Amer: 51 mL/min — ABNORMAL LOW (ref >60)
Glucose, Bld: 143 mg/dL — ABNORMAL HIGH (ref 70–99)
Potassium: 3.5 mmol/L (ref 3.5–5.1)
Sodium: 139 mmol/L (ref 135–145)

## 2020-01-29 LAB — CBC WITH DIFFERENTIAL/PLATELET
Abs Immature Granulocytes: 0.05 10*3/uL (ref 0.00–0.07)
Basophils Absolute: 0.1 10*3/uL (ref 0.0–0.1)
Basophils Relative: 1 %
Eosinophils Absolute: 0.1 10*3/uL (ref 0.0–0.5)
Eosinophils Relative: 1 %
HCT: 48.2 % (ref 39.0–52.0)
Hemoglobin: 17.8 g/dL — ABNORMAL HIGH (ref 13.0–17.0)
Immature Granulocytes: 0 %
Lymphocytes Relative: 30 %
Lymphs Abs: 3.5 10*3/uL (ref 0.7–4.0)
MCH: 33.8 pg (ref 26.0–34.0)
MCHC: 36.9 g/dL — ABNORMAL HIGH (ref 30.0–36.0)
MCV: 91.6 fL (ref 80.0–100.0)
Monocytes Absolute: 0.7 10*3/uL (ref 0.1–1.0)
Monocytes Relative: 6 %
Neutro Abs: 7.4 10*3/uL (ref 1.7–7.7)
Neutrophils Relative %: 62 %
Platelets: 457 10*3/uL — ABNORMAL HIGH (ref 150–400)
RBC: 5.26 MIL/uL (ref 4.22–5.81)
RDW: 11.6 % (ref 11.5–15.5)
WBC: 11.9 10*3/uL — ABNORMAL HIGH (ref 4.0–10.5)
nRBC: 0 % (ref 0.0–0.2)

## 2020-01-29 LAB — MAGNESIUM: Magnesium: 2.1 mg/dL (ref 1.7–2.4)

## 2020-01-29 MED ORDER — ADENOSINE 12 MG/4ML IV SOLN
INTRAVENOUS | Status: AC
  Filled 2020-01-29: qty 4

## 2020-01-29 MED ORDER — ADENOSINE 6 MG/2ML IV SOLN
INTRAVENOUS | Status: AC
  Filled 2020-01-29: qty 2

## 2020-01-29 NOTE — ED Notes (Addendum)
Dr. Fuller Plan at bedside. Pt appears to be ST right now no longer in SVT. Pt denies chest pain but states his heart feels that it's going fast. Pt states having shob. Pt performing maneuvers to convert heart rate himself on nurse and doctor arrival to room.

## 2020-01-29 NOTE — Discharge Instructions (Addendum)
You broke out of your SVT but your kidney function was slightly elevated and your sugar was slightly elevated although no signs of diabetes if this was not a fasting sugar.  You should have these rechecked by your primary care doctor in 1 to 2 weeks.  Drink plenty of fluids and return to the ER if you develop worsening symptoms or any other concerns

## 2020-01-29 NOTE — ED Triage Notes (Signed)
Pt arrival via home due to SVT. Pt has hx of SVT and is currently in this rhythm. Dr. Fuller Plan at bedside.

## 2020-01-29 NOTE — ED Provider Notes (Signed)
[facility] Emergency Department Provider Note  ____________________________________________   First MD Initiated Contact with Patient 01/29/20 1359     (approximate)  I have reviewed the triage vital signs and the nursing notes.   HISTORY  Chief Complaint Tachycardia    HPI Steven Curtis is a 39 y.o. male with SVT who comes in for palpitations.  Patient states that he had sudden onset of palpitations that were constant, severe, nothing made it better, nothing made them worse.  States he has a history of SVT.  Does not report drinking 1 alcoholic beverage today and also missing his metoprolol.  Has a history of this previously and states that he is in SVT.          Past Medical History:  Diagnosis Date  . SVT (supraventricular tachycardia) (HCC)     There are no problems to display for this patient.   History reviewed. No pertinent surgical history.  Prior to Admission medications   Medication Sig Start Date End Date Taking? Authorizing Provider  meloxicam (MOBIC) 15 MG tablet Take 1 tablet (15 mg total) by mouth daily. 12/12/16   Triplett, Cari B, FNP  traMADol (ULTRAM) 50 MG tablet Take 1 tablet (50 mg total) by mouth every 6 (six) hours as needed. 12/12/16   Chinita Pester, FNP    Allergies Patient has no known allergies.  History reviewed. No pertinent family history.  Social History Social History   Tobacco Use  . Smoking status: Not on file  Substance Use Topics  . Alcohol use: Never  . Drug use: Never      Review of Systems Constitutional: No fever/chills Eyes: No visual changes. ENT: No sore throat. Cardiovascular: Denies chest pain.  Palpitations Respiratory: Denies shortness of breath. Gastrointestinal: No abdominal pain.  No nausea, no vomiting.  No diarrhea.  No constipation. Genitourinary: Negative for dysuria. Musculoskeletal: Negative for back pain. Skin: Negative for rash. Neurological: Negative for  headaches, focal weakness or numbness. All other ROS negative ____________________________________________   PHYSICAL EXAM:  VITAL SIGNS: ED Triage Vitals  Enc Vitals Group     BP 01/29/20 1356 (!) 155/97     Pulse Rate 01/29/20 1356 (!) 125     Resp 01/29/20 1356 13     Temp 01/29/20 1356 98.9 F (37.2 C)     Temp src --      SpO2 01/29/20 1356 98 %     Weight 01/29/20 1358 150 lb (68 kg)     Height 01/29/20 1358 5\' 11"  (1.803 m)     Head Circumference --      Peak Flow --      Pain Score 01/29/20 1358 0     Pain Loc --      Pain Edu? --      Excl. in [place]? --     Constitutional: Alert and oriented. Well appearing and in no acute distress. Eyes: Conjunctivae are normal. EOMI. Head: Atraumatic. Nose: No congestion/rhinnorhea. Mouth/Throat: Mucous membranes are moist.   Neck: No stridor. Trachea Midline. FROM Cardiovascular: Tachycardic, regular rhythm. Grossly normal heart sounds.  Good peripheral circulation. Respiratory: Normal respiratory effort.  No retractions. Lungs CTAB. Gastrointestinal: Soft and nontender. No distention. No abdominal bruits.  Musculoskeletal: No lower extremity tenderness nor edema.  No joint effusions. Neurologic:  Normal speech and language. No gross focal neurologic deficits are appreciated.  Skin:  Skin is warm, dry and intact. No rash noted. Psychiatric: Mood and affect are normal. Speech and  behavior are normal. GU: Deferred   ____________________________________________   LABS (all labs ordered are listed, but only abnormal results are displayed)  Labs Reviewed  BASIC METABOLIC PANEL - Abnormal; Notable for the following components:      Result Value   CO2 21 (*)    Glucose, Bld 143 (*)    Creatinine, Ser 1.68 (*)    GFR calc non Af Amer 51 (*)    GFR calc Af Amer 59 (*)    All other components within normal limits  CBC WITH DIFFERENTIAL/PLATELET - Abnormal; Notable for the following components:   WBC 11.9 (*)    Hemoglobin 17.8  (*)    MCHC 36.9 (*)    Platelets 457 (*)    All other components within normal limits  MAGNESIUM   ____________________________________________   ED ECG REPORT I, Vanessa Waverly, the attending physician, personally viewed and interpreted this ECG.  Unfortunately initial EKG is not in the system but it showed SVT with rates in the 200s, no ST elevation, no T wave inversions, otherwise normal intervals   Repeat EKG is sinus tachycardia rate of 109, no ST elevation, no T wave inversions, normal intervals  ___  PROCEDURES  Procedure(s) performed (including Critical Care):  .1-3 Lead EKG Interpretation Performed by: Vanessa [place], MD Authorized by: Vanessa [place], MD     Interpretation: abnormal     ECG rate:  200s tachycardic   ECG rate assessment: tachycardic     Rhythm: SVT     Ectopy: none     Conduction: normal   Comments:     Initially SVT but after vagal maneuvers converted to sinus tachycardia     ____________________________________________   INITIAL IMPRESSION / ASSESSMENT AND PLAN / ED COURSE  Steven Curtis was evaluated in Emergency Department on 01/29/2020 for the symptoms described in the history of present illness. He was evaluated in the context of the global COVID-19 pandemic, which necessitated consideration that the patient might be at risk for infection with the SARS-CoV-2 virus that causes COVID-19. Institutional protocols and algorithms that pertain to the evaluation of patients at risk for COVID-19 are in a state of rapid change based on information released by regulatory bodies including the CDC and federal and state organizations. These policies and algorithms were followed during the patient's care in the ED.    Patient is a 39 year old who has a history of SVT who comes in with SVT.  Most likely secondary to missing his metoprolol dose.  Will get labs evaluate for Electra abnormalities, AKI.  Denies any chest pain and patient is 39 years old has a  history of SVT is unlikely to be secondary to ACS.  No shortness of breath to suggest PE.    Patient converted to sinus tachycardia with vagal maneuvers.  Patient  Wanted to leave without labs but was willing to stay for labs and fluids.  Discussed with patient kidney function slightly elevated 1.6 and he is followed this follow-up with his primary care doctor.  He expressed understanding.  Patient is now back in SVT and would like to be discharged home at this time  I discussed the provisional nature of ED diagnosis, the treatment so far, the ongoing plan of care, follow up appointments and return precautions with the patient and any family or support people present. They expressed understanding and agreed with the plan, discharged home.         ____________________________________________   FINAL CLINICAL IMPRESSION(S) /  ED DIAGNOSES   Final diagnoses:  SVT (supraventricular tachycardia) (HCC)      MEDICATIONS GIVEN DURING THIS VISIT:  Medications - No data to display   ED Discharge Orders    None       Note:  This document was prepared using Dragon voice recognition software and may include unintentional dictation errors.   Concha Se, MD 01/30/20 [phone]

## 2021-08-17 ENCOUNTER — Encounter: Payer: Self-pay | Admitting: Emergency Medicine

## 2021-08-17 ENCOUNTER — Emergency Department: Payer: No Typology Code available for payment source

## 2021-08-17 ENCOUNTER — Other Ambulatory Visit: Payer: Self-pay

## 2021-08-17 DIAGNOSIS — S61210A Laceration without foreign body of right index finger without damage to nail, initial encounter: Secondary | ICD-10-CM | POA: Insufficient documentation

## 2021-08-17 DIAGNOSIS — W312XXA Contact with powered woodworking and forming machines, initial encounter: Secondary | ICD-10-CM | POA: Insufficient documentation

## 2021-08-17 DIAGNOSIS — S6991XA Unspecified injury of right wrist, hand and finger(s), initial encounter: Secondary | ICD-10-CM | POA: Diagnosis present

## 2021-08-17 NOTE — ED Triage Notes (Signed)
Pt reports that he swung the an axe while trying to put up a retaining wall. He has a laceration on his right index finger. Bleeding is minimal at this time. Pressure applied.

## 2021-08-18 ENCOUNTER — Emergency Department
Admission: EM | Admit: 2021-08-18 | Discharge: 2021-08-18 | Disposition: A | Discharge status: Home / Self Care | Payer: No Typology Code available for payment source | Attending: Emergency Medicine | Admitting: Emergency Medicine

## 2021-08-18 DIAGNOSIS — S61210A Laceration without foreign body of right index finger without damage to nail, initial encounter: Secondary | ICD-10-CM

## 2021-08-18 MED ORDER — LIDOCAINE-EPINEPHRINE-TETRACAINE (LET) TOPICAL GEL
3.0000 mL | Freq: Once | TOPICAL | Status: AC
  Administered 2021-08-18: 03:00:00 3 mL via TOPICAL
  Filled 2021-08-18: qty 3

## 2021-08-18 NOTE — ED Provider Notes (Signed)
[facility]  ____________________________________________   Event Date/Time   First MD Initiated Contact with Patient 08/18/21 [phone]     (approximate)  I have reviewed the triage vital signs and the nursing notes.   HISTORY  Chief Complaint Finger Injury    HPI Steven Curtis is a 40 y.o. male with past medical history of SVT presents with a finger injury.  Patient was cutting wood and the handle of the ax hit his right index finger causing laceration.  He has pain at the site, no numbness or weakness.  He is up-to-date on tetanus.         Past Medical History:  Diagnosis Date   SVT (supraventricular tachycardia) (HCC)     There are no problems to display for this patient.   History reviewed. No pertinent surgical history.  Prior to Admission medications   Medication Sig Start Date End Date Taking? Authorizing Provider  meloxicam (MOBIC) 15 MG tablet Take 1 tablet (15 mg total) by mouth daily. 12/12/16   Triplett, Cari B, FNP  traMADol (ULTRAM) 50 MG tablet Take 1 tablet (50 mg total) by mouth every 6 (six) hours as needed. 12/12/16   Chinita Pester, FNP    Allergies Patient has no known allergies.  No family history on file.  Social History Social History   Substance Use Topics   Alcohol use: Never   Drug use: Never    Review of Systems   Review of Systems  Musculoskeletal:  Positive for arthralgias.  Skin:  Positive for wound.  All other systems reviewed and are negative.  Physical Exam Updated Vital Signs BP 138/86 (BP Location: Right Arm)    Pulse 96    Temp 98.4 F (36.9 C) (Oral)    Resp 19    Ht 5\' 11"  (1.803 m)    SpO2 97%    BMI 20.92 kg/m   Physical Exam Vitals and nursing note reviewed.  Constitutional:      General: He is not in acute distress.    Appearance: Normal appearance.  HENT:     Head: Normocephalic and atraumatic.  Eyes:     General: No scleral icterus.    Conjunctiva/sclera: Conjunctivae normal.   Pulmonary:     Effort: Pulmonary effort is normal. No respiratory distress.     Breath sounds: Normal breath sounds. No wheezing.  Musculoskeletal:        General: No deformity or signs of injury.     Cervical back: Normal range of motion.     Comments: Right index finger with a laceration creating a triangular skin flap over the PIP joint No exposed bone or tendon Good cap refill distally  Skin:    Coloration: Skin is not jaundiced or pale.  Neurological:     General: No focal deficit present.     Mental Status: He is alert and oriented to person, place, and time. Mental status is at baseline.  Psychiatric:        Mood and Affect: Mood normal.        Behavior: Behavior normal.     LABS (all labs ordered are listed, but only abnormal results are displayed)  Labs Reviewed - No data to display ____________________________________________  EKG   ____________________________________________  RADIOLOGY , personally viewed and evaluated these images (plain radiographs) as part of my medical decision making, as well as reviewing the written report by the radiologist.  ED MD interpretation: Reviewed the x-ray of the  right index finger which does not show any fracture    ____________________________________________   PROCEDURES  Procedure(s) performed (including Critical Care):  Marland KitchenMarland KitchenLaceration Repair  Date/Time: 08/18/2021 3:58 AM Performed by: Georga Hacking, MD Authorized by: Georga Hacking, MD   Consent:    Consent obtained:  Verbal   Risks discussed:  Pain   Alternatives discussed:  No treatment Universal protocol:    Patient identity confirmed:  Verbally with patient Anesthesia:    Anesthesia method:  Topical application   Topical anesthetic:  LET Laceration details:    Location:  Finger   Finger location:  R index finger   Length (cm):  2 Pre-procedure details:    Preparation:  Imaging obtained to evaluate for foreign  bodies Exploration:    Limited defect created (wound extended): no     Imaging outcome: foreign body not noted     Wound exploration: wound explored through full range of motion     Wound extent: no nerve damage noted, no tendon damage noted, no underlying fracture noted and no vascular damage noted   Treatment:    Area cleansed with:  Saline   Amount of cleaning:  Standard   Irrigation solution:  Tap water   Irrigation method:  Tap   Visualized foreign bodies/material removed: no     Debridement:  None   Undermining:  None   Scar revision: no   Skin repair:    Repair method:  Sutures   Suture size:  5-0   Suture material:  Nylon   Suture technique:  Simple interrupted   Number of sutures:  4 Repair type:    Repair type:  Simple Post-procedure details:    Dressing:  Sterile dressing and splint for protection   Procedure completion:  Tolerated   ____________________________________________   INITIAL IMPRESSION / ASSESSMENT AND PLAN / ED COURSE     Patient is a 40 year old male presents with a laceration to the right index finger.  Occurred when was cutting wood, the handle of his ax hit his right index finger.  It is hemostatic, relatively superficial and there is no exposed bone or tendon.  X-ray does not show acute fracture.  He is up-to-date on tetanus.  Patient preferred not to have lidocaine injection so wound was anesthetized with let and closed with suture.  Return precautions discussed.  He is stable for discharge.      ____________________________________________   FINAL CLINICAL IMPRESSION(S) / ED DIAGNOSES  Final diagnoses:  Laceration of right index finger without foreign body without damage to nail, initial encounter     ED Discharge Orders     None        Note:  This document was prepared using Dragon voice recognition software and may include unintentional dictation errors.    Georga Hacking, MD 08/18/21 [phone]

## 2022-09-21 ENCOUNTER — Emergency Department
Admission: EM | Admit: 2022-09-21 | Discharge: 2022-09-21 | Disposition: A | Discharge status: Home / Self Care | Payer: BC Managed Care – PPO | Attending: Emergency Medicine | Admitting: Emergency Medicine

## 2022-09-21 ENCOUNTER — Other Ambulatory Visit: Payer: Self-pay

## 2022-09-21 ENCOUNTER — Encounter: Payer: Self-pay | Admitting: Emergency Medicine

## 2022-09-21 DIAGNOSIS — I471 Supraventricular tachycardia, unspecified: Secondary | ICD-10-CM | POA: Insufficient documentation

## 2022-09-21 DIAGNOSIS — R002 Palpitations: Secondary | ICD-10-CM | POA: Diagnosis present

## 2022-09-21 LAB — CBC WITH DIFFERENTIAL/PLATELET
Abs Immature Granulocytes: 0.04 10*3/uL (ref 0.00–0.07)
Basophils Absolute: 0.1 10*3/uL (ref 0.0–0.1)
Basophils Relative: 1 %
Eosinophils Absolute: 0.2 10*3/uL (ref 0.0–0.5)
Eosinophils Relative: 2 %
HCT: 43.1 % (ref 39.0–52.0)
Hemoglobin: 15.5 g/dL (ref 13.0–17.0)
Immature Granulocytes: 0 %
Lymphocytes Relative: 30 %
Lymphs Abs: 2.7 10*3/uL (ref 0.7–4.0)
MCH: 33.4 pg (ref 26.0–34.0)
MCHC: 36 g/dL (ref 30.0–36.0)
MCV: 92.9 fL (ref 80.0–100.0)
Monocytes Absolute: 0.6 10*3/uL (ref 0.1–1.0)
Monocytes Relative: 6 %
Neutro Abs: 5.5 10*3/uL (ref 1.7–7.7)
Neutrophils Relative %: 61 %
Platelets: 328 10*3/uL (ref 150–400)
RBC: 4.64 MIL/uL (ref 4.22–5.81)
RDW: 11.9 % (ref 11.5–15.5)
WBC: 9 10*3/uL (ref 4.0–10.5)
nRBC: 0 % (ref 0.0–0.2)

## 2022-09-21 LAB — BASIC METABOLIC PANEL
Anion gap: 5 (ref 5–15)
BUN: 13 mg/dL (ref 6–20)
CO2: 26 mmol/L (ref 22–32)
Calcium: 9 mg/dL (ref 8.9–10.3)
Chloride: 105 mmol/L (ref 98–111)
Creatinine, Ser: 1.16 mg/dL (ref 0.61–1.24)
GFR, Estimated: >60 mL/min (ref >60)
Glucose, Bld: 125 mg/dL — ABNORMAL HIGH (ref 70–99)
Potassium: 3.6 mmol/L (ref 3.5–5.1)
Sodium: 136 mmol/L (ref 135–145)

## 2022-09-21 LAB — MAGNESIUM: Magnesium: 2.1 mg/dL (ref 1.7–2.4)

## 2022-09-21 LAB — TSH: TSH: 2.499 u[IU]/mL (ref 0.350–4.500)

## 2022-09-21 LAB — T4, FREE: Free T4: 0.97 ng/dL (ref 0.61–1.12)

## 2022-09-21 MED ORDER — METOPROLOL SUCCINATE ER 25 MG PO TB24: 25.0000 mg | ORAL_TABLET | Freq: Every day | ORAL | 2 refills | Status: AC

## 2022-09-21 MED ORDER — SODIUM CHLORIDE 0.9 % IV BOLUS (SEPSIS)
1000.0000 mL | Freq: Once | INTRAVENOUS | Status: AC
  Administered 2022-09-21: 1000 mL via INTRAVENOUS

## 2022-09-21 MED ORDER — ADENOSINE 6 MG/2ML IV SOLN
6.0000 mg | Freq: Once | INTRAVENOUS | Status: AC
  Administered 2022-09-21: 6 mg via INTRAVENOUS
  Filled 2022-09-21: qty 2

## 2022-09-21 MED ORDER — LORAZEPAM 2 MG/ML IJ SOLN
1.0000 mg | Freq: Once | INTRAMUSCULAR | Status: AC
  Administered 2022-09-21: 1 mg via INTRAVENOUS
  Filled 2022-09-21: qty 1

## 2022-09-21 MED ORDER — METOPROLOL SUCCINATE ER 25 MG PO TB24
25.0000 mg | ORAL_TABLET | Freq: Once | ORAL | Status: AC
  Administered 2022-09-21: 25 mg via ORAL
  Filled 2022-09-21: qty 1

## 2022-09-21 NOTE — ED Triage Notes (Signed)
Pt to ED from home c/o palpitations tonight.  States feels like he's in SVT again.  Similar happened 2 years ago, had ablation done.  Denies pain or SOB.  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  Pt taken to room 14.

## 2022-09-21 NOTE — ED Provider Notes (Signed)
[facility] Provider Note    Event Date/Time   First MD Initiated Contact with Patient 09/21/22 0205     (approximate)   History   Palpitations (/)   HPI  Steven Curtis is a 42 y.o. male with history of SVT status post ablation about a year ago at [place] who presents to the emergency department with racing heart that started about an hour ago.  No chest pain, shortness of breath.  States he has not been eating and drinking well today but no vomiting, diarrhea.  No increased caffeine intake or stimulant use.   History provided by patient and wife.    Past Medical History:  Diagnosis Date   SVT (supraventricular tachycardia)     History reviewed. No pertinent surgical history.  MEDICATIONS:  Prior to Admission medications   Medication Sig Start Date End Date Taking? Authorizing Provider  meloxicam (MOBIC) 15 MG tablet Take 1 tablet (15 mg total) by mouth daily. 12/12/16   Triplett, Cari B, FNP  traMADol (ULTRAM) 50 MG tablet Take 1 tablet (50 mg total) by mouth every 6 (six) hours as needed. 12/12/16   Victorino Dike, FNP    Physical Exam   Triage Vital Signs: ED Triage Vitals  Enc Vitals Group     BP 09/21/22 0206 (!) 146/115     Pulse Rate 09/21/22 0206 (!) 190     Resp 09/21/22 0206 (!) 22     Temp 09/21/22 0206 (!) 97.5 F (36.4 C)     Temp Source 09/21/22 0206 Oral     SpO2 09/21/22 0206 99 %     Weight 09/21/22 0203 150 lb (68 kg)     Height 09/21/22 0203 6' (1.829 m)     Head Circumference --      Peak Flow --      Pain Score 09/21/22 0203 0     Pain Loc --      Pain Edu? --      Excl. in [place]? --     Most recent vital signs: Vitals:   09/21/22 0230 09/21/22 0234  BP: 134/89 125/80  Pulse: (!) 102 (!) 101  Resp: (!) 22 (!) 23  Temp:  97.6 F (36.4 C)  SpO2: 98% 98%    CONSTITUTIONAL: Alert and oriented and responds appropriately to questions. Well-appearing; well-nourished, slightly anxious appearing HEAD: Normocephalic,  atraumatic EYES: Conjunctivae clear, pupils appear equal, sclera nonicteric ENT: normal nose; moist mucous membranes NECK: Supple, normal ROM CARD: Regular and tachycardic; S1 and S2 appreciated; no murmurs, no clicks, no rubs, no gallops RESP: Normal chest excursion without splinting or tachypnea; breath sounds clear and equal bilaterally; no wheezes, no rhonchi, no rales, no hypoxia or respiratory distress, speaking full sentences ABD/GI: Nondistended BACK: The back appears normal EXT: Normal ROM in all joints; no deformity noted, no edema; no cyanosis, no calf tenderness or calf swelling SKIN: Normal color for age and race; warm; no rash on exposed skin NEURO: Moves all extremities equally, normal speech PSYCH: The patient's mood and manner are appropriate.   ED Results / Procedures / Treatments   LABS: (all labs ordered are listed, but only abnormal results are displayed) Labs Reviewed  BASIC METABOLIC PANEL - Abnormal; Notable for the following components:      Result Value   Glucose, Bld 125 (*)    All other components within normal limits  CBC WITH DIFFERENTIAL/PLATELET  MAGNESIUM  TSH  T4, FREE     EKG:  EKG Interpretation  Date/Time:  Wednesday September 21 2022 02:04:04 EST Ventricular Rate:  187 PR Interval:    QRS Duration: 64 QT Interval:  240 QTC Calculation: 423 R Axis:   71 Text Interpretation: Supraventricular tachycardia Marked ST abnormality, possible inferior subendocardial injury Abnormal ECG When compared with ECG of 29-Jan-2020 14:01, PREVIOUS ECG IS PRESENT Confirmed by Rochele Raring [phone]) on 09/21/2022 2:28:52 AM          EKG Interpretation  Date/Time:  Wednesday September 21 2022 02:16:08 EST Ventricular Rate:  115 PR Interval:  153 QRS Duration: 63 QT Interval:  298 QTC Calculation: 413 R Axis:   70 Text Interpretation: Sinus tachycardia Consider left atrial enlargement Anteroseptal infarct, old Borderline ST depression, diffuse leads  Confirmed by Rochele Raring [phone]) on 09/21/2022 2:29:09 AM          RADIOLOGY: My personal review and interpretation of imaging:    I have personally reviewed all radiology reports.   No results found.   PROCEDURES:  Critical Care performed: Yes, see critical care procedure note(s)   CRITICAL CARE Performed by: Rochele Raring   Total critical care time: 30 minutes  Critical care time was exclusive of separately billable procedures and treating other patients.  Critical care was necessary to treat or prevent imminent or life-threatening deterioration.  Critical care was time spent personally by me on the following activities: development of treatment plan with patient and/or surrogate as well as nursing, discussions with consultants, evaluation of patient's response to treatment, examination of patient, obtaining history from patient or surrogate, ordering and performing treatments and interventions, ordering and review of laboratory studies, ordering and review of radiographic studies, pulse oximetry and re-evaluation of patient's condition.   Marland Kitchen1-3 Lead EKG Interpretation  Performed by: Rubee Vega, Layla Maw, DO Authorized by: Ilhan Madan, Layla Maw, DO     Interpretation: abnormal     ECG rate:  100   ECG rate assessment: tachycardic     Rhythm: sinus tachycardia     Ectopy: none     Conduction: normal       IMPRESSION / MDM / ASSESSMENT AND PLAN / ED COURSE  I reviewed the triage vital signs and the nursing notes.    Patient here with episode of SVT.  Has similar history followed by [facility] cardiology and has had previous ablation.  The patient is on the cardiac monitor to evaluate for evidence of arrhythmia and/or significant heart rate changes.   DIFFERENTIAL DIAGNOSIS (includes but not limited to):   SVT, A-fib, a flutter, electrolyte derangement, anemia, thyroid dysfunction   Patient's presentation is most consistent with acute presentation with potential threat to  life or bodily function.   PLAN: Patient given adenosine 6 mg on arrival and converted quickly into sinus tachycardia.  Still feeling slightly anxious.  Will give him a dose of Ativan to help him rest tonight as he would like to go to work in the morning.  He is getting IV hydration as he states he has not really eaten or drink well today.  Will obtain CBC, BMP, magnesium level, thyroid function test.  Low suspicion for PE, ACS.  No indication to check troponins as they would likely be elevated due to patient's elevated heart rate for over an hour.  Patient and wife would like to go home soon if possible given he is feeling better and back in a sinus rhythm.  He does not want to wait for his labs to result and I understand.  Patient states  he is okay with being monitored for a little while to ensure he does not go back into any abnormal rhythms.  I will contact him with his test results if no abnormal.  We did discuss restarting metoprolol 25 mg extended release daily and having him follow-up with his cardiologist at [facility] closely as an outpatient.   MEDICATIONS GIVEN IN ED: Medications  sodium chloride 0.9 % bolus 1,000 mL (0 mLs Intravenous Stopped 09/21/22 0247)  adenosine (ADENOCARD) 6 MG/2ML injection 6 mg (6 mg Intravenous Given 09/21/22 0215)  LORazepam (ATIVAN) injection 1 mg (1 mg Intravenous Given 09/21/22 0227)  metoprolol succinate (TOPROL-XL) 24 hr tablet 25 mg (25 mg Oral Given 09/21/22 0225)     ED COURSE: Patient monitored in the ED for about 30 minutes.  Heart rate has improved and he is still in sinus rhythm.  No complaints.  Will call him at home with his test results.  Patient's lab work is come back reassuring.  Normal hemoglobin, electrolytes, thyroid function.  I have called patient at home to give him his results.   At this time, I do not feel there is any life-threatening condition present. I reviewed all nursing notes, vitals, pertinent previous records.  All lab and urine  results, EKGs, imaging ordered have been independently reviewed and interpreted by myself.  I reviewed all available radiology reports from any imaging ordered this visit.  Based on my assessment, I feel the patient is safe to be discharged home without further emergent workup and can continue workup as an outpatient as needed. Discussed all findings, treatment plan as well as usual and customary return precautions.  They verbalize understanding and are comfortable with this plan.  Outpatient follow-up has been provided as needed.  All questions have been answered.    CONSULTS:  none   OUTSIDE RECORDS REVIEWED: Reviewed last family medicine note yesterday at [facility].   Reviewed patient's ablation on 12/15/2020 by Dr. Charlaine Dalton at [facility].      FINAL CLINICAL IMPRESSION(S) / ED DIAGNOSES   Final diagnoses:  SVT (supraventricular tachycardia)     Rx / DC Orders   ED Discharge Orders          Ordered    metoprolol succinate (TOPROL XL) 25 MG 24 hr tablet  Daily        09/21/22 0224             Note:  This document was prepared using Dragon voice recognition software and may include unintentional dictation errors.   Ayeden Gladman, Delice Bison, DO 09/21/22 [phone]

## 2022-09-21 NOTE — Discharge Instructions (Addendum)
I recommend close follow-up with your cardiologist at [facility].  I recommend starting metoprolol 25 mg daily.  I will call you if there is any abnormalities in your blood work today.  You may follow-up on these results through [facility].

## 2023-06-17 ENCOUNTER — Emergency Department: Payer: Self-pay

## 2023-06-17 ENCOUNTER — Emergency Department
Admission: EM | Admit: 2023-06-17 | Discharge: 2023-06-17 | Disposition: A | Discharge status: Home / Self Care | Payer: Self-pay | Attending: Emergency Medicine | Admitting: Emergency Medicine

## 2023-06-17 ENCOUNTER — Other Ambulatory Visit: Payer: Self-pay

## 2023-06-17 DIAGNOSIS — I471 Supraventricular tachycardia, unspecified: Secondary | ICD-10-CM | POA: Insufficient documentation

## 2023-06-17 LAB — TSH: TSH: 1.885 u[IU]/mL (ref 0.350–4.500)

## 2023-06-17 LAB — BASIC METABOLIC PANEL
Anion gap: 10 (ref 5–15)
BUN: 19 mg/dL (ref 6–20)
CO2: 22 mmol/L (ref 22–32)
Calcium: 9.5 mg/dL (ref 8.9–10.3)
Chloride: 107 mmol/L (ref 98–111)
Creatinine, Ser: 1.09 mg/dL (ref 0.61–1.24)
GFR, Estimated: >60 mL/min (ref >60)
Glucose, Bld: 107 mg/dL — ABNORMAL HIGH (ref 70–99)
Potassium: 3.9 mmol/L (ref 3.5–5.1)
Sodium: 139 mmol/L (ref 135–145)

## 2023-06-17 LAB — CBC
HCT: 45.8 % (ref 39.0–52.0)
Hemoglobin: 16.1 g/dL (ref 13.0–17.0)
MCH: 33.7 pg (ref 26.0–34.0)
MCHC: 35.2 g/dL (ref 30.0–36.0)
MCV: 95.8 fL (ref 80.0–100.0)
Platelets: 334 10*3/uL (ref 150–400)
RBC: 4.78 MIL/uL (ref 4.22–5.81)
RDW: 11.6 % (ref 11.5–15.5)
WBC: 5.8 10*3/uL (ref 4.0–10.5)
nRBC: 0 % (ref 0.0–0.2)

## 2023-06-17 LAB — T4, FREE: Free T4: 0.78 ng/dL (ref 0.61–1.12)

## 2023-06-17 LAB — TROPONIN I (HIGH SENSITIVITY): Troponin I (High Sensitivity): 10 ng/L (ref <18)

## 2023-06-17 MED ORDER — METOPROLOL TARTRATE 25 MG PO TABS
25.0000 mg | ORAL_TABLET | Freq: Once | ORAL | Status: AC
  Administered 2023-06-17: 25 mg via ORAL
  Filled 2023-06-17: qty 1

## 2023-06-17 NOTE — ED Notes (Signed)
Pt to xray

## 2023-06-17 NOTE — ED Triage Notes (Signed)
Pt comes with c/o SVT. Pt states he was fine this morning and was driving to work when it started. Pt drove himself here. Pt states hx of this and ablation in past. Pt states left arm numbness. Pt denies any cp or sob.

## 2023-06-17 NOTE — Discharge Instructions (Signed)
Follow-up with your primary care provider and cardiologist for ongoing outpatient management of your SVT.  Please return to emergency department for any worsening symptoms

## 2023-06-17 NOTE — ED Provider Notes (Signed)
[facility] Provider Note    Event Date/Time   First MD Initiated Contact with Patient 06/17/23 1224     (approximate)   History   SVT   HPI Neri Samek Berling is a 42 y.o. male with history of SVT s/p ablation presenting today for tachycardia.  Patient reports feeling fine this morning and then when driving to work noticed palpitations and feeling slightly short of breath.  Felt like in the past when he has had episodes of SVT.  Denied chest pain, nausea, vomiting, abdominal pain.  States on arrival to the emergency department and he felt that he had converted back out of SVT.  Currently denying any symptoms at the moment.  Per chart review: Patient had emergency department visit back in January 2024 for SVT and received adenosine at that time as his heart rate was greater than 180 before ultimate discharge.  Has been followed up by cardiology in the past.  Reports that he has as needed metoprolol at home but did not take any today as it occurs so rarely.     Physical Exam   Triage Vital Signs: ED Triage Vitals  Encounter Vitals Group     BP 06/17/23 1223 (!) 139/99     Systolic BP Percentile --      Diastolic BP Percentile --      Pulse Rate 06/17/23 1223 (!) 125     Resp 06/17/23 1223 (!) 22     Temp 06/17/23 1223 98 F (36.7 C)     Temp src --      SpO2 06/17/23 1223 97 %     Weight --      Height --      Head Circumference --      Peak Flow --      Pain Score 06/17/23 1222 0     Pain Loc --      Pain Education --      Exclude from Growth Chart --     Most recent vital signs: Vitals:   06/17/23 1239 06/17/23 1330  BP: (!) 138/103   Pulse: (!) 110 86  Resp: (!) 21 12  Temp:    SpO2: 98% 96%   Physical Exam: I have reviewed the vital signs and nursing notes. General: Awake, alert, no acute distress.  Nontoxic appearing. Head:  Atraumatic, normocephalic.   ENT:  EOM intact, PERRL. Oral mucosa is pink and moist with no lesions. Neck:  Neck is supple with full range of motion, No meningeal signs. Cardiovascular:  tachycardia, RR, No murmurs. Peripheral pulses palpable and equal bilaterally. Respiratory:  Symmetrical chest wall expansion.  No rhonchi, rales, or wheezes.  Good air movement throughout.  No use of accessory muscles.   Musculoskeletal:  No cyanosis or edema. Moving extremities with full ROM Abdomen:  Soft, nontender, nondistended. Neuro:  GCS 15, moving all four extremities, interacting appropriately. Speech clear. Psych:  Calm, appropriate.   Skin:  Warm, dry, no rash.    ED Results / Procedures / Treatments   Labs (all labs ordered are listed, but only abnormal results are displayed) Labs Reviewed  BASIC METABOLIC PANEL - Abnormal; Notable for the following components:      Result Value   Glucose, Bld 107 (*)    All other components within normal limits  CBC  TSH  T4, FREE  TROPONIN I (HIGH SENSITIVITY)  TROPONIN I (HIGH SENSITIVITY)     EKG My EKG interpretation: Rate of 116, sinus tachycardia,  normal axis.  Normal intervals.  No acute ST elevations or depressions.  Not currently in SVT.   RADIOLOGY    PROCEDURES:  Critical Care performed: No  Procedures   MEDICATIONS ORDERED IN ED: Medications  metoprolol tartrate (LOPRESSOR) tablet 25 mg (25 mg Oral Given 06/17/23 1240)     IMPRESSION / MDM / ASSESSMENT AND PLAN / ED COURSE  I reviewed the triage vital signs and the nursing notes.                              Differential diagnosis includes, but is not limited to, paroxysmal SVT, palpitations, sinus tachycardia, electrolyte abnormality  Patient's presentation is most consistent with acute complicated illness / injury requiring diagnostic workup.  \Patient is a 42 year old male presenting today over concerns of a possible SVT.  History of similar and notices heart rate was very elevated and felt similar to comparable levels in the past.  On arrival here no longer appears in  SVT with heart rates in the upper 120s.  Laboratory workup performed was largely reassuring with no acute findings.  Patient was given 25 mg metoprolol orally with improvement in heart rate into the 80s.  Reassessed and asymptomatic.  Likely in paroxysmal SVT given prior history.  Otherwise safe for discharge at this time and told to follow-up with cardiologist for ongoing management.  Given strict return precautions.  The patient is on the cardiac monitor to evaluate for evidence of arrhythmia and/or significant heart rate changes. Clinical Course as of 06/17/23 1355  Sat Jun 17, 2023  1300 Troponin I (High Sensitivity): 10 Not currently in SVT and no chest pain symptoms.  No need to repeat at this time. [DW]  1300 Basic metabolic panel(!) unremarkable [DW]  1301 CBC unremarkable [DW]  1301 DG Chest 2 View No acute cardiopulmonary abnormalities [DW]  1308 Heart rate now down to 89 and in normal sinus rhythm [DW]    Clinical Course User Index [DW] Janith Lima, MD     FINAL CLINICAL IMPRESSION(S) / ED DIAGNOSES   Final diagnoses:  SVT (supraventricular tachycardia) (HCC)     Rx / DC Orders   ED Discharge Orders     None        Note:  This document was prepared using Dragon voice recognition software and may include unintentional dictation errors.   Janith Lima, MD 06/17/23 1357

## 2024-03-14 ENCOUNTER — Emergency Department: Payer: Self-pay

## 2024-03-14 ENCOUNTER — Emergency Department
Admission: EM | Admit: 2024-03-14 | Discharge: 2024-03-14 | Disposition: A | Discharge status: Home / Self Care | Payer: Self-pay | Attending: Emergency Medicine | Admitting: Emergency Medicine

## 2024-03-14 ENCOUNTER — Other Ambulatory Visit: Payer: Self-pay

## 2024-03-14 DIAGNOSIS — I471 Supraventricular tachycardia, unspecified: Secondary | ICD-10-CM | POA: Insufficient documentation

## 2024-03-14 LAB — CBC
HCT: 48.3 % (ref 39.0–52.0)
Hemoglobin: 16.7 g/dL (ref 13.0–17.0)
MCH: 33.7 pg (ref 26.0–34.0)
MCHC: 34.6 g/dL (ref 30.0–36.0)
MCV: 97.6 fL (ref 80.0–100.0)
Platelets: 405 K/uL — ABNORMAL HIGH (ref 150–400)
RBC: 4.95 MIL/uL (ref 4.22–5.81)
RDW: 11.8 % (ref 11.5–15.5)
WBC: 12.2 K/uL — ABNORMAL HIGH (ref 4.0–10.5)
nRBC: 0 % (ref 0.0–0.2)

## 2024-03-14 LAB — BASIC METABOLIC PANEL WITH GFR
Anion gap: 14 (ref 5–15)
BUN: 15 mg/dL (ref 6–20)
CO2: 22 mmol/L (ref 22–32)
Calcium: 9.6 mg/dL (ref 8.9–10.3)
Chloride: 99 mmol/L (ref 98–111)
Creatinine, Ser: 1.13 mg/dL (ref 0.61–1.24)
GFR, Estimated: >60 mL/min (ref >60)
Glucose, Bld: 140 mg/dL — ABNORMAL HIGH (ref 70–99)
Potassium: 3.9 mmol/L (ref 3.5–5.1)
Sodium: 135 mmol/L (ref 135–145)

## 2024-03-14 LAB — TROPONIN I (HIGH SENSITIVITY): Troponin I (High Sensitivity): 21 ng/L — ABNORMAL HIGH (ref <18)

## 2024-03-14 MED ORDER — ADENOSINE 6 MG/2ML IV SOLN
INTRAVENOUS | Status: AC
  Administered 2024-03-14: 6 mg
  Filled 2024-03-14: qty 2

## 2024-03-14 MED ORDER — ADENOSINE 12 MG/4ML IV SOLN
INTRAVENOUS | Status: AC
  Filled 2024-03-14: qty 4

## 2024-03-14 NOTE — Discharge Instructions (Signed)
 Reduce alcohol use as this may contribute to recurrent episodes of SVT.  Speak with your cardiologist about a follow-up appointment.  Thank you for choosing us  for your health care today!  Please see your primary doctor this week for a follow up appointment.   If you have any new, worsening, or unexpected symptoms call your doctor right away or come back to the emergency department for reevaluation.  It was my pleasure to care for you today.   Ginnie EDISON Cyrena, MD

## 2024-03-14 NOTE — ED Provider Notes (Signed)
 [facility] Provider Note    Event Date/Time   First MD Initiated Contact with Patient 03/14/24 [phone]     (approximate)   History   Chest Pain   HPI  Steven Curtis is a 43 y.o. male   Past medical history of Recurrent SVT, alcohol use who presents with palpitations found to be in SVT with a rate in the 150s.  Normotensive.  Patient is a daily alcohol drinker and drank just before coming.  Denies drug use.  States in regular state of health until he felt palpitations just prior to arrival.  He tried to take his metoprolol  as instructed by his cardiologist but despite this palpitations continued.  Denies chest pain.  He states that he has never had to be cardioverted and would like to avoid being shocked and would instead like adenosine  which has worked well in the past.   Independent Historian contributed to assessment above: Wife at bedside corroborates information  External Medical Documents Reviewed: Family medicine notes from May 2025      Physical Exam   Triage Vital Signs: ED Triage Vitals  Encounter Vitals Group     BP 03/14/24 0257 (!) 150/107     Girls Systolic BP Percentile --      Girls Diastolic BP Percentile --      Boys Systolic BP Percentile --      Boys Diastolic BP Percentile --      Pulse Rate 03/14/24 0257 (!) 124     Resp 03/14/24 0257 20     Temp 03/14/24 0257 98.2 F (36.8 C)     Temp Source 03/14/24 0257 Oral     SpO2 03/14/24 0257 100 %     Weight 03/14/24 0245 145 lb (65.8 kg)     Height 03/14/24 0245 5' 11 (1.803 m)     Head Circumference --      Peak Flow --      Pain Score 03/14/24 0245 2     Pain Loc --      Pain Education --      Exclude from Growth Chart --     Most recent vital signs: Vitals:   03/14/24 0315 03/14/24 0330  BP:  (!) 134/95  Pulse: (!) 101 (!) 104  Resp: 16 14  Temp:    SpO2: 100% 100%    General: Awake, no distress.  CV:  Good peripheral perfusion.  Resp:  Normal effort.   Abd:  No distention.  Other:  Awake interactive normotensive and tachycardic.  Lungs clear heart sounds rapid rate.  Does not appear to be in alcohol drawl with no tremors   ED Results / Procedures / Treatments   Labs (all labs ordered are listed, but only abnormal results are displayed) Labs Reviewed  BASIC METABOLIC PANEL WITH GFR - Abnormal; Notable for the following components:      Result Value   Glucose, Bld 140 (*)    All other components within normal limits  CBC - Abnormal; Notable for the following components:   WBC 12.2 (*)    Platelets 405 (*)    All other components within normal limits  TROPONIN I (HIGH SENSITIVITY) - Abnormal; Notable for the following components:   Troponin I (High Sensitivity) 21 (*)    All other components within normal limits     I ordered and reviewed the above labs they are notable for white cell count slightly elevated 12 troponin 21  EKG  ED ECG  REPORT I, Ginnie Shams, the attending physician, personally viewed and interpreted this ECG.   Date: 03/14/2024  EKG Time: 0246  Rate: 186  Rhythm: svt  Axis: nl  Intervals:nl  ST&T Change: no stemi   PROCEDURES:  Critical Care performed: Yes, see critical care procedure note(s)  .Critical Care  Performed by: Shams Ginnie, MD Authorized by: Shams Ginnie, MD   Critical care provider statement:    Critical care time (minutes):  30   Critical care was time spent personally by me on the following activities:  Development of treatment plan with patient or surrogate, discussions with consultants, evaluation of patient's response to treatment, examination of patient, ordering and review of laboratory studies, ordering and review of radiographic studies, ordering and performing treatments and interventions, pulse oximetry, re-evaluation of patient's condition and review of old charts    MEDICATIONS ORDERED IN ED: Medications  adenosine  (ADENOCARD ) 12 MG/4ML injection (  Not Given 03/14/24  0334)  adenosine  (ADENOCARD ) 6 MG/2ML injection (6 mg  Given 03/14/24 0302)     IMPRESSION / MDM / ASSESSMENT AND PLAN / ED COURSE  I reviewed the triage vital signs and the nursing notes.                                Patient's presentation is most consistent with acute presentation with potential threat to life or bodily function.  Differential diagnosis includes, but is not limited to, SVT, electrolyte derangement, withdrawal, alcohol intoxication, considered but less likely ACS   The patient is on the cardiac monitor to evaluate for evidence of arrhythmia and/or significant heart rate changes.  MDM:    Recurrent SVT in this patient probably induced by his significant alcohol use.  He does not appear to be in withdrawal.  He is normotensive.  He converted with 6 mg of adenosine .  His electrolytes are unremarkable.  His initial troponin was 21 likely in the setting of his SVT.  He has no chest pain to suggest cardiac ischemia and would like to go home now that he is converted would not like to stay to finish his fluids nor get a repeat troponin.  He will follow-up with his cardiologist.       FINAL CLINICAL IMPRESSION(S) / ED DIAGNOSES   Final diagnoses:  SVT (supraventricular tachycardia) (HCC)     Rx / DC Orders   ED Discharge Orders     None        Note:  This document was prepared using Dragon voice recognition software and may include unintentional dictation errors.    Shams Ginnie, MD 03/14/24 [phone]

## 2024-03-14 NOTE — ED Triage Notes (Signed)
 Pt presents via POV c/o SVT and chest pain. Reports has a hx of SVT. Took 50mg  Metoprolol  at home per pt report.
